# Patient Record
Sex: Female | Born: 1950
Health system: Southern US, Community
[De-identification: ages and names within clinical notes are randomized; demographics above are authoritative.]

## PROBLEM LIST (undated history)

## (undated) DIAGNOSIS — M8580 Other specified disorders of bone density and structure, unspecified site: Secondary | ICD-10-CM

## (undated) DIAGNOSIS — S52133A Displaced fracture of neck of unspecified radius, initial encounter for closed fracture: Secondary | ICD-10-CM

## (undated) DIAGNOSIS — F419 Anxiety disorder, unspecified: Secondary | ICD-10-CM

## (undated) DIAGNOSIS — F32A Depression, unspecified: Secondary | ICD-10-CM

## (undated) DIAGNOSIS — K635 Polyp of colon: Secondary | ICD-10-CM

## (undated) DIAGNOSIS — E039 Hypothyroidism, unspecified: Secondary | ICD-10-CM

## (undated) DIAGNOSIS — G2581 Restless legs syndrome: Secondary | ICD-10-CM

## (undated) DIAGNOSIS — K589 Irritable bowel syndrome without diarrhea: Secondary | ICD-10-CM

## (undated) DIAGNOSIS — K219 Gastro-esophageal reflux disease without esophagitis: Secondary | ICD-10-CM

## (undated) DIAGNOSIS — E7800 Pure hypercholesterolemia, unspecified: Secondary | ICD-10-CM

## (undated) DIAGNOSIS — F329 Major depressive disorder, single episode, unspecified: Secondary | ICD-10-CM

## (undated) DIAGNOSIS — G4733 Obstructive sleep apnea (adult) (pediatric): Secondary | ICD-10-CM

## (undated) HISTORY — DX: Other specified disorders of bone density and structure, unspecified site: M85.80

## (undated) HISTORY — DX: Displaced fracture of neck of unspecified radius, initial encounter for closed fracture: S52.133A

## (undated) HISTORY — DX: Restless legs syndrome: G25.81

## (undated) HISTORY — PX: BREAST BIOPSY: SHX20

## (undated) HISTORY — DX: Pure hypercholesterolemia, unspecified: E78.00

## (undated) HISTORY — DX: Gastro-esophageal reflux disease without esophagitis: K21.9

## (undated) HISTORY — DX: Hypothyroidism, unspecified: E03.9

## (undated) HISTORY — DX: Irritable bowel syndrome, unspecified: K58.9

## (undated) HISTORY — DX: Major depressive disorder, single episode, unspecified: F32.9

## (undated) HISTORY — DX: Obstructive sleep apnea (adult) (pediatric): G47.33

## (undated) HISTORY — PX: TONSILLECTOMY: SUR1361

## (undated) HISTORY — DX: Anxiety disorder, unspecified: F41.9

## (undated) HISTORY — DX: Depression, unspecified: F32.A

## (undated) HISTORY — DX: Polyp of colon: K63.5

---

## 1997-12-05 HISTORY — PX: ABDOMINAL HYSTERECTOMY: SHX81

## 1997-12-05 HISTORY — PX: APPENDECTOMY: SHX54

## 2003-07-24 LAB — HM DEXA SCAN

## 2005-05-17 ENCOUNTER — Ambulatory Visit: Payer: Self-pay | Admitting: Endocrinology

## 2005-09-12 ENCOUNTER — Ambulatory Visit: Payer: Self-pay | Admitting: Unknown Physician Specialty

## 2006-11-09 ENCOUNTER — Ambulatory Visit: Payer: Self-pay | Admitting: Unknown Physician Specialty

## 2007-10-10 ENCOUNTER — Emergency Department (HOSPITAL_COMMUNITY): Admission: EM | Admit: 2007-10-10 | Discharge: 2007-10-10 | Payer: Self-pay | Admitting: Emergency Medicine

## 2007-12-06 HISTORY — PX: ESOPHAGOGASTRODUODENOSCOPY: SHX1529

## 2008-02-19 ENCOUNTER — Ambulatory Visit: Payer: Self-pay | Admitting: Unknown Physician Specialty

## 2008-04-07 ENCOUNTER — Ambulatory Visit: Payer: Self-pay | Admitting: Unknown Physician Specialty

## 2009-02-19 ENCOUNTER — Ambulatory Visit: Payer: Self-pay | Admitting: Unknown Physician Specialty

## 2009-12-05 HISTORY — PX: COLONOSCOPY: SHX174

## 2010-03-16 LAB — HM MAMMOGRAPHY: HM Mammogram: NEGATIVE

## 2010-05-25 ENCOUNTER — Ambulatory Visit: Payer: Self-pay | Admitting: Unknown Physician Specialty

## 2010-06-10 ENCOUNTER — Ambulatory Visit: Payer: Self-pay | Admitting: Unknown Physician Specialty

## 2010-06-14 LAB — PATHOLOGY REPORT

## 2010-07-22 ENCOUNTER — Ambulatory Visit: Payer: Self-pay | Admitting: Unknown Physician Specialty

## 2010-07-28 ENCOUNTER — Ambulatory Visit: Payer: Self-pay | Admitting: Unknown Physician Specialty

## 2010-08-17 ENCOUNTER — Encounter: Admission: RE | Admit: 2010-08-17 | Discharge: 2010-08-17 | Payer: Self-pay | Admitting: Otolaryngology

## 2011-03-07 ENCOUNTER — Encounter (INDEPENDENT_AMBULATORY_CARE_PROVIDER_SITE_OTHER): Payer: 59 | Admitting: Family Medicine

## 2011-03-07 DIAGNOSIS — Z79899 Other long term (current) drug therapy: Secondary | ICD-10-CM

## 2011-03-07 DIAGNOSIS — E039 Hypothyroidism, unspecified: Secondary | ICD-10-CM

## 2011-03-07 DIAGNOSIS — Z0000 Encounter for general adult medical examination without abnormal findings: Secondary | ICD-10-CM

## 2011-03-07 DIAGNOSIS — E559 Vitamin D deficiency, unspecified: Secondary | ICD-10-CM

## 2011-03-07 DIAGNOSIS — E7800 Pure hypercholesterolemia, unspecified: Secondary | ICD-10-CM

## 2011-04-18 ENCOUNTER — Ambulatory Visit (INDEPENDENT_AMBULATORY_CARE_PROVIDER_SITE_OTHER): Payer: 59 | Admitting: Family Medicine

## 2011-04-18 ENCOUNTER — Ambulatory Visit: Payer: 59 | Admitting: Family Medicine

## 2011-04-18 ENCOUNTER — Encounter: Payer: Self-pay | Admitting: Family Medicine

## 2011-04-18 VITALS — BP 108/72 | HR 68 | Ht 66.0 in | Wt 177.0 lb

## 2011-04-18 DIAGNOSIS — F411 Generalized anxiety disorder: Secondary | ICD-10-CM | POA: Insufficient documentation

## 2011-04-18 DIAGNOSIS — K219 Gastro-esophageal reflux disease without esophagitis: Secondary | ICD-10-CM

## 2011-04-18 DIAGNOSIS — R7301 Impaired fasting glucose: Secondary | ICD-10-CM

## 2011-04-18 DIAGNOSIS — E7800 Pure hypercholesterolemia, unspecified: Secondary | ICD-10-CM | POA: Insufficient documentation

## 2011-04-18 DIAGNOSIS — E611 Iron deficiency: Secondary | ICD-10-CM

## 2011-04-18 DIAGNOSIS — E039 Hypothyroidism, unspecified: Secondary | ICD-10-CM | POA: Insufficient documentation

## 2011-04-18 LAB — CBC WITH DIFFERENTIAL/PLATELET
Basophils Absolute: 0.1 10*3/uL (ref 0.0–0.1)
HCT: 37.3 % (ref 36.0–46.0)
Hemoglobin: 11.9 g/dL — ABNORMAL LOW (ref 12.0–15.0)
Lymphocytes Relative: 37 % (ref 12–46)
Lymphs Abs: 1.5 10*3/uL (ref 0.7–4.0)
MCH: 26.8 pg (ref 26.0–34.0)
Monocytes Relative: 9 % (ref 3–12)
Neutrophils Relative %: 49 % (ref 43–77)
WBC: 4.1 10*3/uL (ref 4.0–10.5)

## 2011-04-18 NOTE — Progress Notes (Signed)
Patient presents today upon the recommendation of Dr. Imogene Burn, her psychiatrist.  He did some labs revealing a low ferritin (8), as well as normal B12 and Mg.  He told her to f/u with her PCP to discuss.  We did labs last month at her CPE, which showed a normal CBC, Hgb of 12.5. She denies vaginal bleeding, no recent flare of hemorrhoid bleeding, denies any bloody or black stools, no blood in urine, nosebleeds or other bleeding. She takes Nexium daily for reflux.  As long as she watches her diet closely, reflux is well controlled.  Has recurrent symptoms if misses 2-3 pills  Past Medical History  Diagnosis Date  . Vitamin D deficiency   . IBS (irritable bowel syndrome) constipation(dr elliot [place])  . Depression panic(dr.william chen)  . RLS (restless legs syndrome)   . GERD (gastroesophageal reflux disease) HH per pt EGD nl 2/06  . Hypothyroid   . Colon polyps   . Osteopenia hx  . Elevated cholesterol   . OSA (obstructive sleep apnea) moderate, not using CPAP, aware of risks    Past Surgical History  Procedure Date  . Abdominal hysterectomy 1999  . Colonoscopy 2011  . Appendectomy 1999    History   Social History  . Marital Status: Married    Spouse Name: N/A    Number of Children: N/A  . Years of Education: N/A   Occupational History  . Not on file.   Social History Main Topics  . Smoking status: Never Smoker   . Smokeless tobacco: Never Used  . Alcohol Use: Yes     socially, 1 glass wine bi-weekly  . Drug Use: No  . Sexually Active: Not on file   Other Topics Concern  . Not on file   Social History Narrative  . No narrative on file    Family History  Problem Relation Age of Onset  . Heart disease Mother   . Cancer Father     stomach and liver  . Sarcoidosis Sister   . Heart disease Brother 18  . Heart disease Maternal Aunt   . Heart disease Paternal Aunt   . Heart disease Paternal Uncle     Current outpatient prescriptions:Cholecalciferol (VITAMIN D)  2000 UNITS tablet, Take 2,000 Units by mouth daily.  , Disp: , Rfl: ;  desvenlafaxine (PRISTIQ) 50 MG 24 hr tablet, Take 50 mg by mouth daily.  , Disp: , Rfl: ;  esomeprazole (NEXIUM) 40 MG capsule, Take 40 mg by mouth daily before breakfast.  , Disp: , Rfl:  estrogens, conjugated, (PREMARIN) 0.45 MG tablet, Take 0.45 mg by mouth daily. Take daily for 21 days then do not take for 7 days. , Disp: , Rfl: ;  gabapentin (NEURONTIN) 300 MG capsule, Take 300 mg by mouth 3 (three) times daily.  , Disp: , Rfl: ;  levothyroxine (SYNTHROID, LEVOTHROID) 25 MCG tablet, Take 25 mcg by mouth daily.  , Disp: , Rfl: ;  LORazepam (ATIVAN) 0.5 MG tablet, Take 0.5 mg by mouth as needed.  , Disp: , Rfl:  magnesium gluconate (MAGONATE) 500 MG tablet, Take 500 mg by mouth as needed.  , Disp: , Rfl: ;  baclofen (LIORESAL) 10 MG tablet, , Disp: , Rfl:   Allergies  Allergen Reactions  . Zithromax (Azithromycin) Hives    ROS: RLS better since changing from Topamax to Neurontin.  Denies fevers, GI complaints, URI symptoms, any bleeding as per history of present illness, or other concerns.  Physical exam: Well-developed, well-nourished,  pleasant female in no distress BP 108/72  Pulse 68  Ht 5\' 6"  (1.676 m)  Wt 177 lb (80.287 kg)  BMI 28.57 kg/m2 Remainder of exam is limited to discussion. Full CPE last month.  Assessment and plan: 1. Iron deficiency  CBC with Differential, Ferritin, Iron, CBC with Differential  2. Impaired fasting glucose  HgB A1c, Glucose  3. GERD (gastroesophageal reflux disease)     We discussed the meaning of low ferritin in the absence of anemia. We discussed that her chronic PPI use may be impairing her absorption of iron. She requested repeat CBC today. Given handout of high iron foods. Future labs ordered 2 repeat CBC as well as fasting glucose and A1c

## 2011-04-18 NOTE — Patient Instructions (Signed)
Iron Rich Diet An iron rich diet contains foods that are good sources of iron. Iron is an important mineral. It is used to make hemoglobin. Hemoglobin is a protein needed for red blood cells so that oxygen can be carried through the body. The iron level in the blood can be low. Reasons for low iron in the blood include:  Lack of iron in the diet.   Blood loss.   During times of growth such as the growth and development of children or pregnancy.  Low levels of iron can cause a decrease in the number of red blood cells. This can result in iron deficiency anemia. Iron deficiency anemia symptoms include:  Lack of energy.   Increased chance of infection.   Other health problems.  Males older than 60 years of age need 8mg of iron per day. Women ages 19-50 need 18mg per day. Pregnant women need 27 mg per day, and women who are over 19 years of age and breastfeeding need 9mg per day. Women over the age of 50 need 8mg of iron per day. SOURCES OF IRON: There are two types of iron that are found in food; heme and non-heme iron. Heme iron is absorbed by the body better than non-heme iron. Heme iron is found in meat, poultry and fish. Non-heme iron is found in grains, beans, and vegetables. Heme iron sources:  Food Amount of iron in milligrams (mg)  3 oz. chicken liver.  10  3 oz. beef liver. 5.5  3 oz. oysters. 8  3 oz. beef. 2-3  3 oz. shrimp. 2.8  3 oz. turkey. 2  3 oz. chicken. 1  3 oz. fish (tuna, halibut). 1  3 oz. pork. 0.9  Non-heme iron sources: Food Amount of iron in milligrams (mg)  Ready-to-eat breakfast cereal, iron fortified. 3.9-7   cup tofu. 3.4   cup kidney beans. 2.6  Baked potato with skin. 2.7   cup asparagus. 2.2  Avocado. 2   cup dried peaches. 1.6   cup raisins. 1.5   1 cup soy milk. 1.5  1 slice whole wheat bread. 1.2  1 cup spinach. 0.8   cup broccoli. 0.6  CERTAIN FOODS INCREASE AND DECREASE IRON ABSORPTION: Foods that can DECREASE the body's absorption  of iron include:  Coffee.  Tea.   Fiber.  Soy.   Try to avoid these foods and beverages while eating meals with iron containing foods. Foods containing vitamin C INCREASE the body's absorption of iron. Foods that are high in vitamin C include many fruits and vegetables. Some good sources are:  Fresh orange juice.  Oranges.   Strawberries.   Mangos.   Grapefruit.   Red bell peppers.  Green bell peppers.   Broccoli.   Potatoes with skin.   Tomato juice.   Foods containing vitamin C can help increase the amount of iron your body absorbs from iron sources, especially from non-heme sources. Eat foods with vitamin C along with iron containing foods to increase your iron absorption. Document Released: 07/05/2005 Document Re-Released: 12/11/2007 Exit[employer] Patient Information 2011 [employer]. 

## 2011-04-20 ENCOUNTER — Telehealth: Payer: Self-pay | Admitting: *Deleted

## 2011-04-20 MED ORDER — INTEGRA F 125-1 MG PO CAPS: 1.0000 | ORAL_CAPSULE | Freq: Every day | ORAL | Status: DC

## 2011-04-20 NOTE — Telephone Encounter (Signed)
Spoke with pt ZO:XWRU. Pt notified that HGB was slightly low. Given samples of Integra 125mg  #8 and an RX for one month plus one refill. Pt is also scheduled for labs on 06/15/11 @ 8:30 am, we will add cbcd to that for 2 month recheck. Also given hemoccult x3, she will send back to [place] when completed.

## 2011-05-24 ENCOUNTER — Encounter: Payer: Self-pay | Admitting: Family Medicine

## 2011-05-24 DIAGNOSIS — G43909 Migraine, unspecified, not intractable, without status migrainosus: Secondary | ICD-10-CM | POA: Insufficient documentation

## 2011-06-13 ENCOUNTER — Encounter: Payer: Self-pay | Admitting: Family Medicine

## 2011-06-15 ENCOUNTER — Encounter: Payer: Self-pay | Admitting: Family Medicine

## 2011-06-15 ENCOUNTER — Ambulatory Visit (INDEPENDENT_AMBULATORY_CARE_PROVIDER_SITE_OTHER): Payer: 59 | Admitting: Family Medicine

## 2011-06-15 VITALS — BP 108/68 | HR 68 | Ht 66.0 in | Wt 178.0 lb

## 2011-06-15 DIAGNOSIS — E7800 Pure hypercholesterolemia, unspecified: Secondary | ICD-10-CM

## 2011-06-15 DIAGNOSIS — E611 Iron deficiency: Secondary | ICD-10-CM

## 2011-06-15 DIAGNOSIS — R7301 Impaired fasting glucose: Secondary | ICD-10-CM | POA: Insufficient documentation

## 2011-06-15 DIAGNOSIS — K219 Gastro-esophageal reflux disease without esophagitis: Secondary | ICD-10-CM

## 2011-06-15 LAB — POC HEMOCCULT BLD/STL (HOME/3-CARD/SCREEN): Fecal Occult Blood, POC: NEGATIVE

## 2011-06-15 NOTE — Progress Notes (Signed)
Patient presents for f/u on anemia.  Has been off of her Nexium for 2 weeks, as her GI wouldn't refill it without an appointment (as she had been taking it for so long).  She has an appointment scheduled with him soon. Seems to be doing well, with only occasional reflux.  Using Zantac prn with good relief.   She was put on Integra after last visit, but only took it for about a week, due to problems with constipation.  Trying to follow high iron diet.  Denies any bleeding, blood in stool or worsening fatigue. She brings in her stool cards today, which were negative x 3. She continues to work out very regularly, and is frustrated with the lack of weight loss--although she does admit that her clothes are fitting better.  She is also here for repeat labs--her fasting glucose was 112 in April, and she is here for repeat fasting glucose, as well as HbA1c testing to evaluate for diabetes.  Her lipids were elevated then, (Total chol 237, TG 175, HDL 54, LDL 148), and HS-CRP was average risk.  She is interested in getting particle size testing--specifically asking about Matthew Folks tests  Past Medical History  Diagnosis Date  . Vitamin D deficiency   . IBS (irritable bowel syndrome) constipation(dr elliot [place])  . RLS (restless legs syndrome)   . GERD (gastroesophageal reflux disease) HH per pt EGD nl 2/06  . Colon polyps   . Osteopenia hx  . Elevated cholesterol   . OSA (obstructive sleep apnea) moderate, not using CPAP, aware of risks    2007  . Depression panic(dr.william chen)  . Anxiety     GENERALIZED/WILLIAM CHEN MD  . Hypothyroid     Past Surgical History  Procedure Date  . Abdominal hysterectomy 1999    and BSO  . Colonoscopy 2011    Dr. Mechele Collin [facility], [place])  . Appendectomy 1999  . Esophagogastroduodenoscopy 2009    History   Social History  . Marital Status: Married    Spouse Name: N/A    Number of Children: N/A  . Years of Education: N/A   Occupational  History  . Not on file.   Social History Main Topics  . Smoking status: Never Smoker   . Smokeless tobacco: Never Used  . Alcohol Use: Yes     socially, 1 glass wine bi-weekly  . Drug Use: No  . Sexually Active: Not on file   Other Topics Concern  . Not on file   Social History Narrative  . No narrative on file    Family History  Problem Relation Age of Onset  . Heart disease Mother   . Cancer Father     stomach and liver  . Sarcoidosis Sister   . Heart disease Brother 49  . Heart disease Maternal Aunt   . Heart disease Paternal Aunt   . Heart disease Paternal Uncle     Current outpatient prescriptions:Cholecalciferol (VITAMIN D) 2000 UNITS tablet, Take 2,000 Units by mouth daily.  , Disp: , Rfl: ;  desvenlafaxine (PRISTIQ) 50 MG 24 hr tablet, Take 50 mg by mouth daily.  , Disp: , Rfl: ;  estrogens, conjugated, (PREMARIN) 0.45 MG tablet, Take 0.45 mg by mouth daily. , Disp: , Rfl: ;  gabapentin (NEURONTIN) 300 MG capsule, Take 300 mg by mouth 3 (three) times daily.  , Disp: , Rfl:  levothyroxine (LEVOXYL) 25 MCG tablet, Take 25 mcg by mouth daily.  , Disp: , Rfl: ;  magnesium gluconate (  MAGONATE) 500 MG tablet, Take 250 mg by mouth as needed. , Disp: , Rfl: ;  baclofen (LIORESAL) 10 MG tablet, , Disp: , Rfl: ;  esomeprazole (NEXIUM) 40 MG capsule, Take 40 mg by mouth daily before breakfast.  , Disp: , Rfl: ;  Fe Fum-FePoly-FA-Vit C-Vit B3 (INTEGRA F) 125-1 MG CAPS, Take 1 tablet by mouth daily., Disp: 8 capsule, Rfl: 0 LORazepam (ATIVAN) 0.5 MG tablet, Take 0.5 mg by mouth as needed.  , Disp: , Rfl:   Allergies  Allergen Reactions  . Zithromax (Azithromycin) Hives   ROS:  Denies fevers, URI symptoms, cough, SOB, chest pain.  See HPI re: GI, weight, lack of bleeding, etc.   PHYSICAL EXAM: BP 108/68  Pulse 68  Ht 5\' 6"  (1.676 m)  Wt 178 lb (80.74 kg)  BMI 28.73 kg/m2 Well developed, pleasant female in no distress Neck: no lymphadenopathy or thyromegaly Heart: regular  rate and rhythm without murmur Lungs: clear bilaterally Abdomen: No epigastric tenderness, hepatosplenomegaly or masses Extremities: no edema Skin: no rash  ASSESSMENT/PLAN: 1. Iron deficiency  Iron, Ferritin, CBC with Differential  2. Impaired fasting glucose  POCT HgB A1C, POCT Glucose (CBG)  3. Other disorders of iron metabolism  POCT Hemoccult (HOME) Blood/Stoool Cards  4. Pure hypercholesterolemia    5. GERD (gastroesophageal reflux disease)     Discussed longterm risks of untreated reflux vs PPI's.  Seems to be doing well with prn H2 blocker, I rec to continue this Low cholesterol diet reviewed Patient reassured that losing inches at the waist is as beneficial as losing weight, to continue with her current level of activity, and likely weight loss will soon follow.  [place] lipids  (requested by pt)  Send copies of labs to Dr. Lynnae Prude at [facility], in [place]

## 2011-06-16 ENCOUNTER — Telehealth: Payer: Self-pay | Admitting: *Deleted

## 2011-06-16 LAB — CBC WITH DIFFERENTIAL/PLATELET
Basophils Relative: 1 % (ref 0–1)
Eosinophils Relative: 2 % (ref 0–5)
Hemoglobin: 12.3 g/dL (ref 12.0–15.0)
Lymphs Abs: 1.6 10*3/uL (ref 0.7–4.0)
MCHC: 31.3 g/dL (ref 30.0–36.0)
MCV: 86 fL (ref 78.0–100.0)
Neutro Abs: 2 10*3/uL (ref 1.7–7.7)
Platelets: 256 10*3/uL (ref 150–400)
RBC: 4.57 MIL/uL (ref 3.87–5.11)
WBC: 4.2 10*3/uL (ref 4.0–10.5)

## 2011-06-16 LAB — FERRITIN: Ferritin: 8 ng/mL — ABNORMAL LOW (ref 10–291)

## 2011-06-16 NOTE — Telephone Encounter (Signed)
Spoke with patient and informed her of her lab results. Also let her know that I faxed current and previous labs to Dr.Elliott and I will be in touch once [facility] results are in.

## 2011-06-16 NOTE — Telephone Encounter (Signed)
Left message with patient's husband to return my call for lab results. Faxed labs from this encounter as well as the previous 2-3 to Dr.Elliot @ [facility] in [place]. Faxed to [phone].

## 2011-07-25 ENCOUNTER — Encounter: Payer: Self-pay | Admitting: Family Medicine

## 2011-07-25 ENCOUNTER — Ambulatory Visit (INDEPENDENT_AMBULATORY_CARE_PROVIDER_SITE_OTHER): Payer: 59 | Admitting: Family Medicine

## 2011-07-25 VITALS — BP 118/86 | HR 72 | Temp 98.4°F | Ht 66.0 in | Wt 171.0 lb

## 2011-07-25 DIAGNOSIS — J069 Acute upper respiratory infection, unspecified: Secondary | ICD-10-CM

## 2011-07-25 NOTE — Progress Notes (Signed)
Patient presents with complaint of sinus pain, fever, cough.  Began 2 days ago with sneezing, intense pain in her left cheek and upper and lower teeth.  Denies any runny nose, but feeling congested.  Now the pain is limited to her L cheek, teeth no longer painful.  Began coughing last night, nonproductive.  T100 this morning at home.  +sick contacts--husband began sneezing same time as her, but he is better.  Hasn't tried any OTC medications.  Doesn't like taking decongestants--they increase her anxiety  Past Medical History  Diagnosis Date  . Vitamin D deficiency   . IBS (irritable bowel syndrome) constipation(dr elliot [place])  . RLS (restless legs syndrome)   . GERD (gastroesophageal reflux disease) HH per pt EGD nl 2/06  . Colon polyps   . Osteopenia hx  . Elevated cholesterol   . OSA (obstructive sleep apnea) moderate, not using CPAP, aware of risks    2007  . Depression panic(dr.william chen)  . Anxiety     GENERALIZED/WILLIAM CHEN MD  . Hypothyroid     Past Surgical History  Procedure Date  . Abdominal hysterectomy 1999    and BSO  . Colonoscopy 2011    Dr. Mechele Collin [facility], [place])  . Appendectomy 1999  . Esophagogastroduodenoscopy 2009    History   Social History  . Marital Status: Married    Spouse Name: N/A    Number of Children: N/A  . Years of Education: N/A   Occupational History  . Not on file.   Social History Main Topics  . Smoking status: Never Smoker   . Smokeless tobacco: Never Used  . Alcohol Use: Yes     socially, 1 glass wine bi-weekly  . Drug Use: No  . Sexually Active: Not on file   Other Topics Concern  . Not on file   Social History Narrative  . No narrative on file    Family History  Problem Relation Age of Onset  . Heart disease Mother   . Cancer Father     stomach and liver  . Sarcoidosis Sister   . Heart disease Brother 30  . Heart disease Maternal Aunt   . Heart disease Paternal Aunt   . Heart disease  Paternal Uncle     Current outpatient prescriptions:Cholecalciferol (VITAMIN D) 2000 UNITS tablet, Take 2,000 Units by mouth daily.  , Disp: , Rfl: ;  desvenlafaxine (PRISTIQ) 50 MG 24 hr tablet, Take 50 mg by mouth daily.  , Disp: , Rfl: ;  estrogens, conjugated, (PREMARIN) 0.45 MG tablet, Take 0.45 mg by mouth daily. , Disp: , Rfl: ;  gabapentin (NEURONTIN) 300 MG capsule, Take 300 mg by mouth 3 (three) times daily.  , Disp: , Rfl:  levothyroxine (LEVOXYL) 25 MCG tablet, Take 25 mcg by mouth daily.  , Disp: , Rfl: ;  LORazepam (ATIVAN) 0.5 MG tablet, Take 0.5 mg by mouth as needed.  , Disp: , Rfl: ;  Magnesium 250 MG TABS, Take 1 tablet by mouth daily.  , Disp: , Rfl: ;  ranitidine (ZANTAC) 150 MG tablet, Take 150 mg by mouth as needed.  , Disp: , Rfl: ;  baclofen (LIORESAL) 10 MG tablet, , Disp: , Rfl:  esomeprazole (NEXIUM) 40 MG capsule, Take 40 mg by mouth daily before breakfast.  , Disp: , Rfl: ;  Fe Fum-FePoly-FA-Vit C-Vit B3 (INTEGRA F) 125-1 MG CAPS, Take 1 tablet by mouth daily., Disp: 8 capsule, Rfl: 0  Allergies  Allergen Reactions  . Zithromax (Azithromycin)  Hives   ROS:  See HPI.  Denies nausea, vomiting, skin rash, joint pains, or other concerns  PHYSICAL EXAM: BP 118/86  Pulse 72  Temp(Src) 98.4 F (36.9 C) (Oral)  Ht 5\' 6"  (1.676 m)  Wt 171 lb (77.565 kg)  BMI 27.60 kg/m2 Well developed female, moderately congested, in no distress.  Occasional dry cough HEENT: PERRL, EOMI, conjunctiva clear.  TM's and EAC's normal. Nasal mucosa only mildly edematous, no erythema or purulence.  Tender at L maxillary sinus.  OP-Mild cobblestoning posteriorly Lungs clear bilaterally Skin: no rash  ASSESSMENT/PLAN: 1. URI (upper respiratory infection)   Most likely viral, no evidence of bacterial infection at this time. Discussed signs and symptoms of progression to sinus infection  Mucinex Sinus rinses Patient doesn't tolerate decongestants due to increased anxiety symptoms  Call  later this week if worsening symptoms for ABX--allergic to z-pak, so would call in Augmentin.  Use with probiotics to help with the diarrhea.  If having new/different symptoms (ie. Chest pain, SOB) then OV recommended for re-evaluation.  Patient voices understanding.  Unable to determine what happened with specimen taken over a month ago and supposedly sent to [facility] results ever received.  Will check with Jovanka when she returns from vacation

## 2011-07-25 NOTE — Patient Instructions (Signed)
Call later this week if worsening symptoms for antibiotics--allergic to z-pak, so would call in Augmentin.  Use with probiotics to help with the diarrhea.  If having new/different symptoms (ie. Chest pain, SOB) then OV recommended for re-evaluation  Mucinex 1200mg  twice daily (NOT the D or DM, plain/maximum strength is okay) Try either sinus rinses or Neti-pot to help flush the sinuses and decrease pain Use anti-inflammatories (ie advil, motrin or aleve) and/or tylenol for pain, fever

## 2011-07-28 ENCOUNTER — Encounter: Payer: Self-pay | Admitting: Family Medicine

## 2011-07-28 DIAGNOSIS — E538 Deficiency of other specified B group vitamins: Secondary | ICD-10-CM | POA: Insufficient documentation

## 2011-07-29 ENCOUNTER — Telehealth: Payer: Self-pay | Admitting: Family Medicine

## 2011-07-29 MED ORDER — AMOXICILLIN-POT CLAVULANATE 875-125 MG PO TABS: 1.0000 | ORAL_TABLET | Freq: Two times a day (BID) | ORAL | Status: AC

## 2011-07-29 NOTE — Telephone Encounter (Signed)
LMOM that rx was sent to pharmacy

## 2011-08-01 ENCOUNTER — Other Ambulatory Visit: Payer: Self-pay | Admitting: *Deleted

## 2011-08-02 ENCOUNTER — Other Ambulatory Visit (INDEPENDENT_AMBULATORY_CARE_PROVIDER_SITE_OTHER): Payer: 59

## 2011-08-02 DIAGNOSIS — E538 Deficiency of other specified B group vitamins: Secondary | ICD-10-CM

## 2011-08-02 MED ORDER — CYANOCOBALAMIN 1000 MCG/ML IJ SOLN
1000.0000 ug | Freq: Once | INTRAMUSCULAR | Status: AC
  Administered 2011-08-02: 1000 ug via INTRAMUSCULAR

## 2011-08-09 ENCOUNTER — Other Ambulatory Visit (INDEPENDENT_AMBULATORY_CARE_PROVIDER_SITE_OTHER): Payer: 59

## 2011-08-09 DIAGNOSIS — E538 Deficiency of other specified B group vitamins: Secondary | ICD-10-CM

## 2011-08-09 MED ORDER — CYANOCOBALAMIN 1000 MCG/ML IJ SOLN
1000.0000 ug | Freq: Once | INTRAMUSCULAR | Status: AC
  Administered 2011-08-09: 1000 ug via INTRAMUSCULAR

## 2011-08-16 ENCOUNTER — Other Ambulatory Visit: Payer: 59

## 2011-08-23 ENCOUNTER — Other Ambulatory Visit (INDEPENDENT_AMBULATORY_CARE_PROVIDER_SITE_OTHER): Payer: 59

## 2011-08-23 DIAGNOSIS — E538 Deficiency of other specified B group vitamins: Secondary | ICD-10-CM

## 2011-08-23 MED ORDER — CYANOCOBALAMIN 1000 MCG/ML IJ SOLN
1000.0000 ug | Freq: Once | INTRAMUSCULAR | Status: AC
  Administered 2011-08-23: 1000 ug via INTRAMUSCULAR

## 2011-08-30 ENCOUNTER — Other Ambulatory Visit (INDEPENDENT_AMBULATORY_CARE_PROVIDER_SITE_OTHER): Payer: 59

## 2011-08-30 DIAGNOSIS — E559 Vitamin D deficiency, unspecified: Secondary | ICD-10-CM

## 2011-08-30 DIAGNOSIS — E538 Deficiency of other specified B group vitamins: Secondary | ICD-10-CM

## 2011-08-30 MED ORDER — CYANOCOBALAMIN 1000 MCG/ML IJ SOLN
1000.0000 ug | Freq: Once | INTRAMUSCULAR | Status: AC
  Administered 2011-08-30: 1000 ug via INTRAMUSCULAR

## 2011-09-12 ENCOUNTER — Telehealth: Payer: Self-pay | Admitting: Family Medicine

## 2011-09-12 NOTE — Telephone Encounter (Signed)
Please cal re B12  Gi suggested shots and then oral dosage. She finished shot and now time to start oral.  [facility]   [phone]

## 2011-09-12 NOTE — Telephone Encounter (Signed)
Spoke with patient and informed her that Dr.Knapp said B12 is an OTC supplement, usually 1mg . I checked in chart and [facility] did not specify an amount so I told her she may want to double check with them to make sure that 1mg  is the correct dosage that they want her to take. Pt verbalized understanding.

## 2011-09-12 NOTE — Telephone Encounter (Signed)
Please check note from GI at [facility]. Usually oral B12 is 1mg  daily, and is available OTC.  Please confirm with chart and notify pt.  Thanks

## 2012-01-10 ENCOUNTER — Ambulatory Visit: Payer: Self-pay | Admitting: Unknown Physician Specialty

## 2012-04-04 ENCOUNTER — Telehealth: Payer: Self-pay | Admitting: Family Medicine

## 2012-04-04 MED ORDER — LEVOTHYROXINE SODIUM 25 MCG PO TABS: 25.0000 ug | ORAL_TABLET | Freq: Every day | ORAL | Status: DC

## 2012-04-04 NOTE — Telephone Encounter (Signed)
Spoke with patient and she scheduled a CPE for 05/24/12-is this too long to wait to get TSH level? She said she would prefer to have labs done at CPE as opposed to labs prior if that is ok. Also she said that the pharmacy gave her levothyroxine a few months ago and she has been taking them instead of the brand name and said she feels better than she did on the brand name-is this ok to refill with the generic @ ?

## 2012-04-04 NOTE — Telephone Encounter (Signed)
Left message for patient to return my call.

## 2012-04-04 NOTE — Telephone Encounter (Signed)
It is fine to refill the generic until her appt

## 2012-04-04 NOTE — Telephone Encounter (Signed)
Please pull chart and see when last TSH done (none in computer, so she is likely due for med check and labs). Okay to refill until appt--with recall on Levoxyl, will need to change to Synthroid.  Please advise pt of need to change--keep on branded med.

## 2012-05-02 ENCOUNTER — Encounter: Payer: Self-pay | Admitting: Family Medicine

## 2012-05-02 ENCOUNTER — Ambulatory Visit (INDEPENDENT_AMBULATORY_CARE_PROVIDER_SITE_OTHER): Payer: 59 | Admitting: Family Medicine

## 2012-05-02 VITALS — BP 120/70 | HR 64 | Temp 97.8°F | Ht 66.0 in | Wt 179.0 lb

## 2012-05-02 DIAGNOSIS — R300 Dysuria: Secondary | ICD-10-CM

## 2012-05-02 LAB — POCT URINALYSIS DIPSTICK
Blood, UA: NEGATIVE
Ketones, UA: NEGATIVE
Protein, UA: NEGATIVE
pH, UA: 5

## 2012-05-02 MED ORDER — CIPROFLOXACIN HCL 250 MG PO TABS: 250.0000 mg | ORAL_TABLET | Freq: Two times a day (BID) | ORAL | Status: AC

## 2012-05-02 MED ORDER — FLUCONAZOLE 150 MG PO TABS: 150.0000 mg | ORAL_TABLET | Freq: Once | ORAL | Status: AC

## 2012-05-02 NOTE — Patient Instructions (Signed)
Drink plenty of fluids. If your symptoms of urgency, frequency, burning with urination get worse/recur, then please start the Cipro, and take it for the full 5 days.    Start the diflucan only if/when you get symptoms of a yeast infection.  We will call your cell phone with urine culture results when we get them.

## 2012-05-02 NOTE — Progress Notes (Signed)
Chief Complaint  Patient presents with  . Urinary Tract Infection    starting Friday morning with cloudiness and an strong odor until Monday. Since Monday she has had burning with urination but with more burning after voiding.     HPI: 5 days ago, noticed different odor to urine, and it was very cloudy.  Over the next few days, had similar symptoms in the morning, but later in the day had no problems (no odor or cloudiness).  2 days ago still had odor and cloudiness in the morning, but then also had some burning during the day.  This morning was less cloudy, less odor.  Yesterday was the worst as far as the burning with urinations.  Also had some L-sided lower abdominal burning that persisted after voiding, which would last about 1/2 hour.  Had some urgency and frequency yesterday, but seems better today.  Stream was somewhat weaker yesterday also.  Has been drinking more fluids today than over the weekend   Hypothyroid--in error got generic from pharmacy 3 months ago.  Is feeling a little better, so continued with it.  Past Medical History  Diagnosis Date  . Vitamin d deficiency   . IBS (irritable bowel syndrome) constipation(dr elliot [place])  . RLS (restless legs syndrome)   . GERD (gastroesophageal reflux disease) HH per pt EGD nl 2/06  . Colon polyps   . Osteopenia hx  . Elevated cholesterol   . OSA (obstructive sleep apnea) moderate, not using CPAP, aware of risks    2007  . Depression panic(dr.william chen)  . Anxiety     GENERALIZED/WILLIAM CHEN MD  . Hypothyroid    Past Surgical History  Procedure Date  . Abdominal hysterectomy 1999    and BSO  . Colonoscopy 2011    Dr. Mechele Collin [facility], [place])  . Appendectomy 1999  . Esophagogastroduodenoscopy 2009   History   Social History  . Marital Status: Married    Spouse Name: N/A    Number of Children: N/A  . Years of Education: N/A   Occupational History  . Not on file.   Social History Main Topics    . Smoking status: Never Smoker   . Smokeless tobacco: Never Used  . Alcohol Use: Yes     socially, 1 glass wine bi-weekly  . Drug Use: No  . Sexually Active: Not on file   Other Topics Concern  . Not on file   Social History Narrative  . No narrative on file   Current Outpatient Prescriptions on File Prior to Visit  Medication Sig Dispense Refill  . Cholecalciferol (VITAMIN D) 2000 UNITS tablet Take 2,000 Units by mouth daily.        Marland Kitchen desvenlafaxine (PRISTIQ) 50 MG 24 hr tablet Take 50 mg by mouth daily.        Marland Kitchen gabapentin (NEURONTIN) 300 MG capsule Take 300 mg by mouth at bedtime.       Marland Kitchen levothyroxine (LEVOXYL) 25 MCG tablet Take 1 tablet (25 mcg total) by mouth daily.  30 tablet  1  . LORazepam (ATIVAN) 0.5 MG tablet Take 0.5 mg by mouth as needed.        . Magnesium 250 MG TABS Take 1 tablet by mouth daily.        . baclofen (LIORESAL) 10 MG tablet       . ranitidine (ZANTAC) 150 MG tablet Take 150 mg by mouth as needed.         Allergies  Allergen Reactions  .  Zithromax (Azithromycin) Hives    ROS: Denies nausea, vomiting, fevers, chills, flank pain, other GI complaints.  Denies vaginal discharge.  PHYSICAL EXAM: BP 120/70  Pulse 64  Temp(Src) 97.8 F (36.6 C) (Oral)  Ht 5\' 6"  (1.676 m)  Wt 179 lb (81.194 kg)  BMI 28.89 kg/m2 Well developed, pleasant female in no distress Abdomen: tender across entire lower abdomen.  No rebound, guarding or masses Back: no CVA tenderness  Urine dip normal  ASSESSMENT/PLAN: 1. Burning with urination  POCT Urinalysis Dipstick, ciprofloxacin (CIPRO) 250 MG tablet, fluconazole (DIFLUCAN) 150 MG tablet, Urine culture    Given fairly classic symptoms of UTI, but symptoms improved today with increased hydration, will send for culture.  Will give Rx for Cipro--to start in the next 1-2 days if symptoms persist/worsen (she is going out of town).  Call with culture results when available. Also sending rx for diflucan given h/o yeast  infections with all ABX.  Understands to complete the full course of ABX if she starts them, and to only use the diflucan when symptoms develop.  Cell [phone]--call with results   Hypothyroidism--discussed the fact that Levoxyl won't be available again until 2014; doing well on generic.  Will continue that for now

## 2012-05-05 LAB — URINE CULTURE: Colony Count: >=100000

## 2012-05-15 ENCOUNTER — Telehealth: Payer: Self-pay | Admitting: Family Medicine

## 2012-05-15 MED ORDER — CIPROFLOXACIN HCL 500 MG PO TABS: 500.0000 mg | ORAL_TABLET | Freq: Two times a day (BID) | ORAL | Status: DC

## 2012-05-15 NOTE — Telephone Encounter (Signed)
Advise pt--will rx higher dose of Cipro, x 7 days.  If symptoms don't improve, I'd like to send another urine culture.  Rx sent to pharmacy for pt.  Please advise.  Septra (a sulfa drug) would be another antibiotic option to try, as her recent urine culture showed sensitivity to both sulfa drug and cipro--if she prefers sulfa over higher dose of cipro, then ok to change to septra DS BID x 7 days instead of the cipro. (no allergy listed in chart).

## 2012-05-15 NOTE — Telephone Encounter (Signed)
Pt called and informed and she said she would stay with the cipro

## 2012-05-16 IMAGING — US ULTRASOUND LEFT BREAST
1 series · 17 of 25 positions shown · non-contrast
Comparison: none

REASON FOR EXAM: DENSITY
COMMENTS:

[Series 1: ultrasound left breast · 17 of 41 slices shown]
[im 1/41]
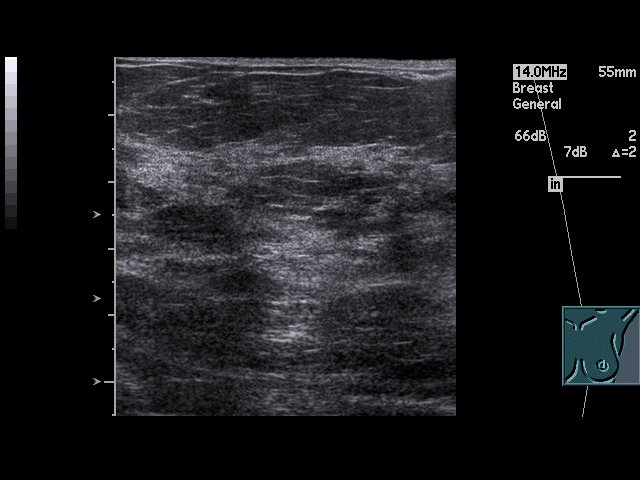
[im 4/41]
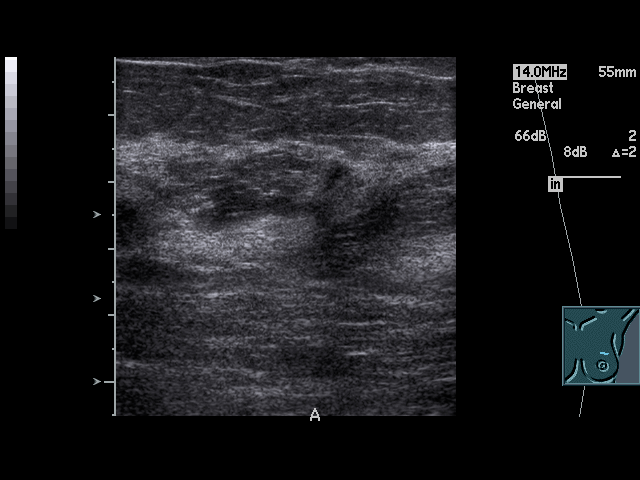
[im 6/41]
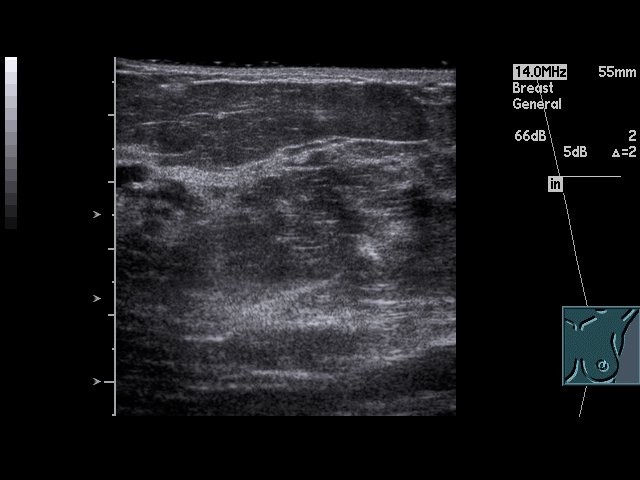
[im 9/41]
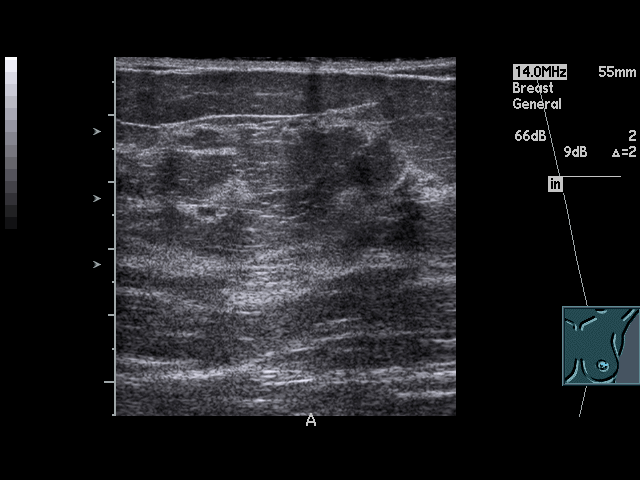
[im 11/41]
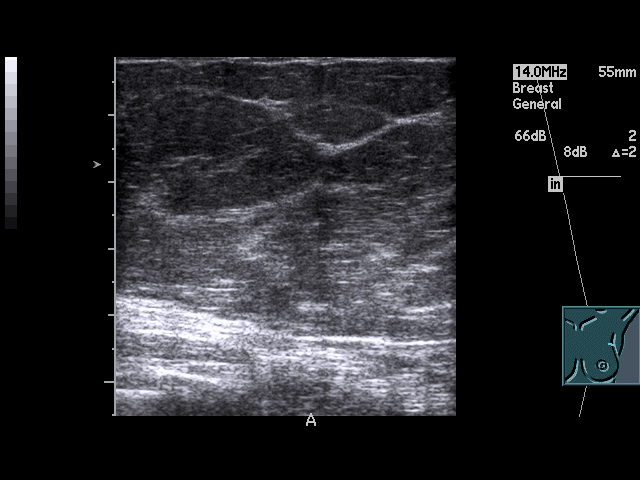
[im 14/41]
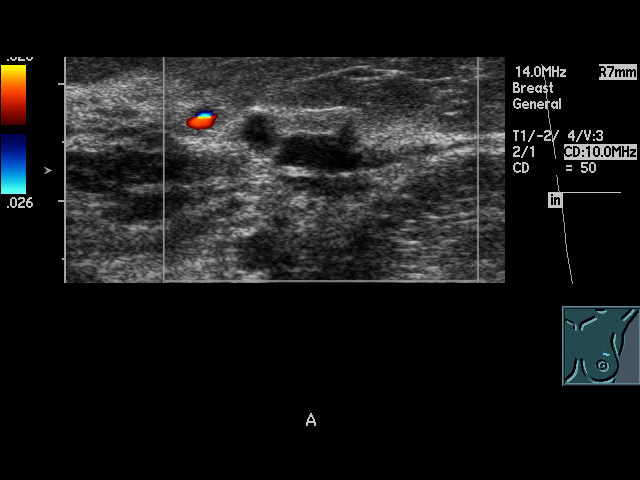
[im 16/41]
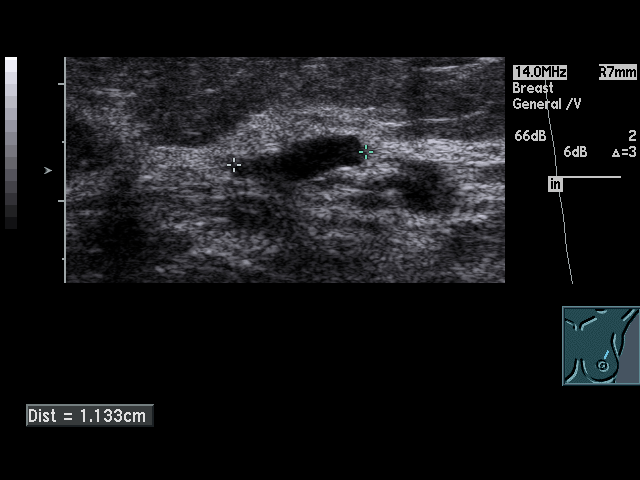
[im 19/41]
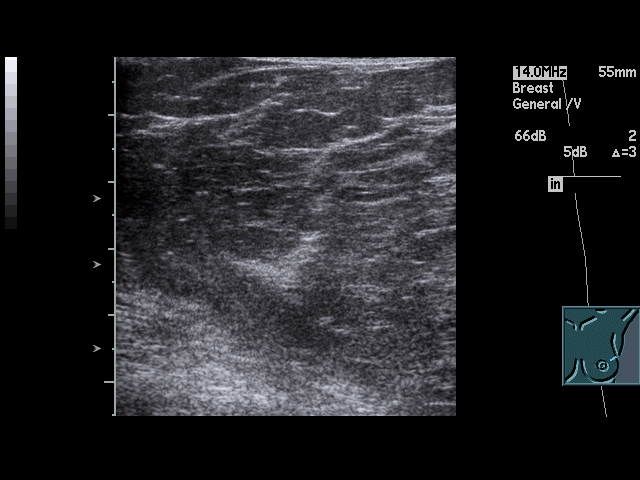
[im 21/41]
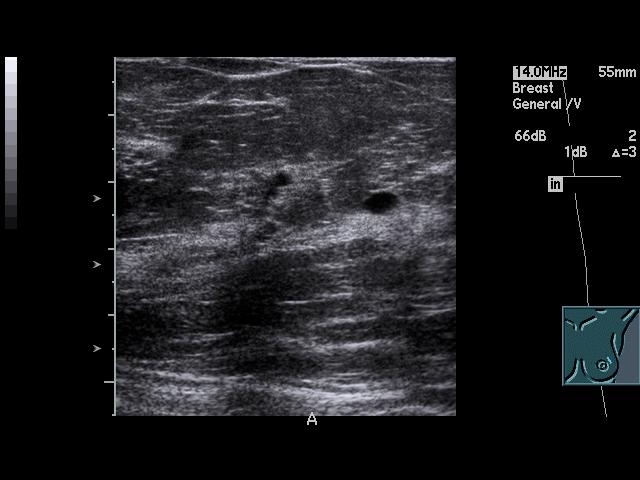
[im 22/41]
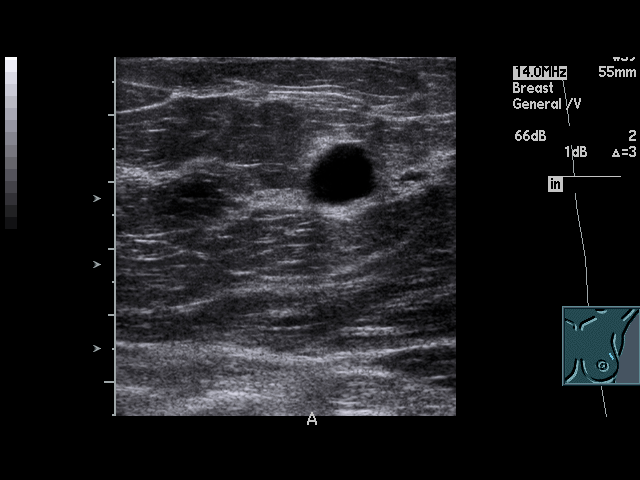
[im 26/41]
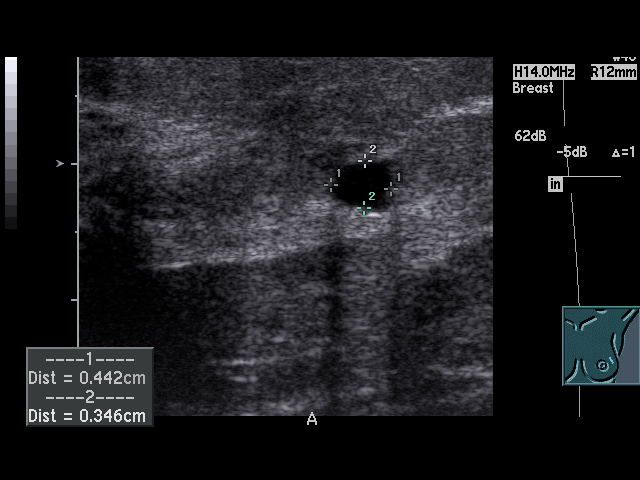
[im 27/41]
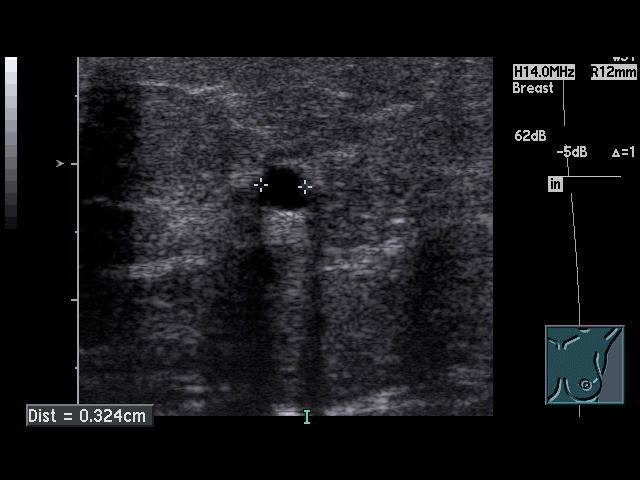
[im 31/41]
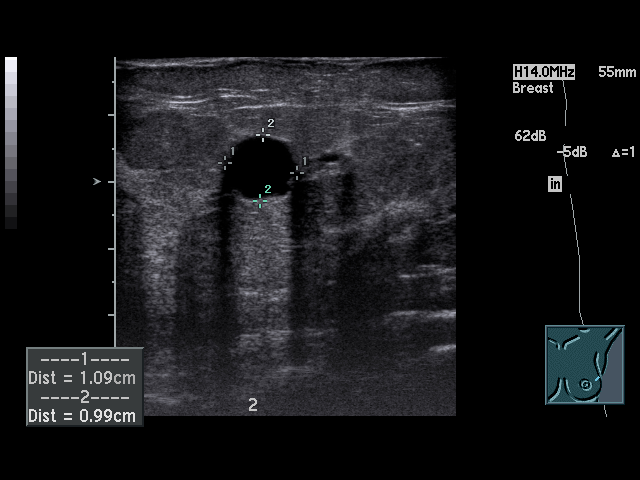
[im 32/41]
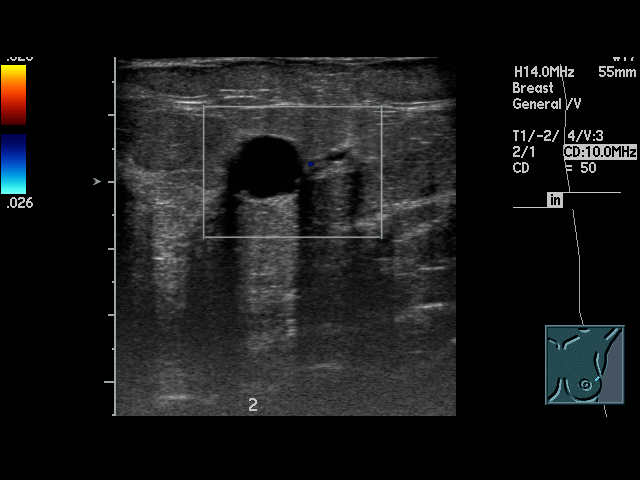
[im 36/41]
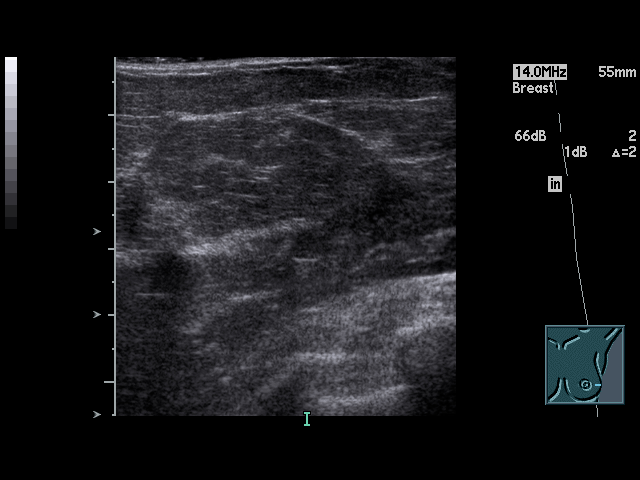
[im 37/41]
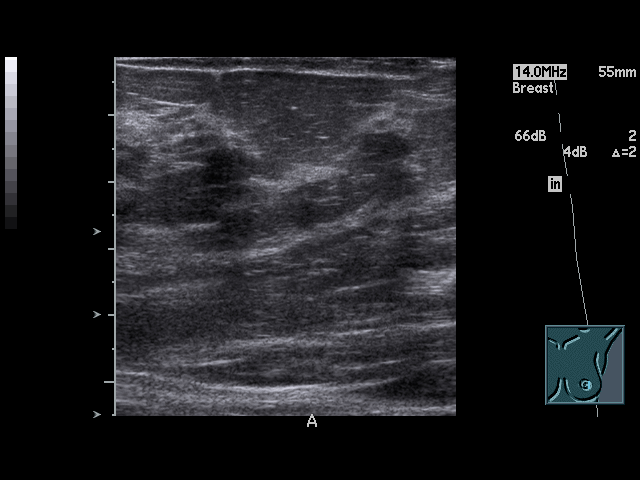
[im 41/41]
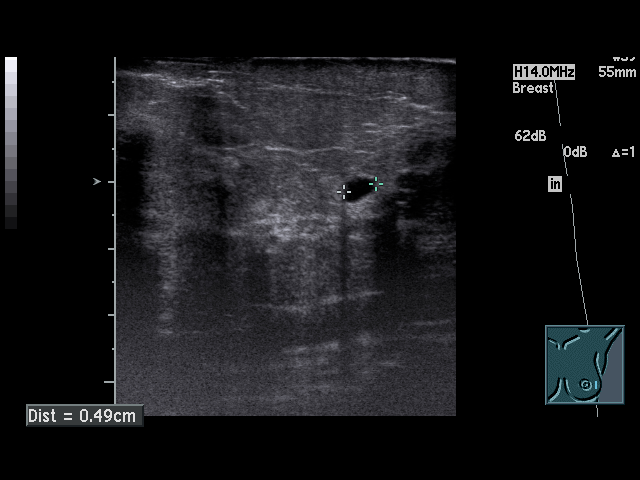

[17 of 25 positions shown; findings below may reference images not displayed]

PROCEDURE:     US  - US BREAST LEFT  - July 28, 2010  [DATE]

RESULT:

On the mammographic study [DATE] and on today's supplemental
mammographic views there are densities in the upper outer aspect of the left
breast. At ultrasound these are seen to be anechoic and somewhat irregularly
marginated in several regions. In addition, there is a hypoechoic area at
the 1 o'clock position. At the 2 o'clock position there is a dominant cystic
appearing structure measuring 11 x 10 x 14 mm. Smaller cystic structures at
the 2 o'clock position and 3 o'clock position are demonstrated. The
irregularly marginated, hypoechoic region at the 1 o'clock position likely
reflects dilated ducts and measures 1.5 and 0.5 x 1.1 cm and does not
clearly correspond to a mammographic density.
IMPRESSION: There are multiple cysts in the upper outer quadrant of the
left breast as well as likely a small group of dilated ducts. Please see the
dictation for the mammogram of this same day for final recommendations and
BI-RADS classification.

## 2012-05-21 ENCOUNTER — Telehealth: Payer: Self-pay | Admitting: *Deleted

## 2012-05-21 DIAGNOSIS — N390 Urinary tract infection, site not specified: Secondary | ICD-10-CM

## 2012-05-21 MED ORDER — SULFAMETHOXAZOLE-TMP DS 800-160 MG PO TABS: 1.0000 | ORAL_TABLET | Freq: Two times a day (BID) | ORAL | Status: DC

## 2012-05-21 NOTE — Telephone Encounter (Signed)
She will come in tomorrow for UA and Urine culture dx:599.0.  Called into Septra DS #14 BID x 7 days to [facility] and she will start medication AFTER she gives clean catch specimen tomorrow.

## 2012-05-21 NOTE — Telephone Encounter (Signed)
Patient called and stated that she has been on 2 rounds of Cipro for her UTI-last telephone call stated that she could either take a higher dose of Cipro (which she chose to do) or a sulfa abx. You suggested to do another urine culture if symptoms did not improve. Patient is very uncomfortable, still having burning after urination as well as some increased frequency. She has a CPE scheduled for 05/24/12. Please advise.

## 2012-05-21 NOTE — Telephone Encounter (Signed)
Have her give [place] a clean catch urine specimen for urine dip and culture.  We need this prior to starting any additional ABX. If she prefers to change to sulfa while waiting results, okay for #14 of Septra DS

## 2012-05-22 ENCOUNTER — Other Ambulatory Visit (INDEPENDENT_AMBULATORY_CARE_PROVIDER_SITE_OTHER): Payer: 59

## 2012-05-22 DIAGNOSIS — N390 Urinary tract infection, site not specified: Secondary | ICD-10-CM

## 2012-05-22 LAB — POCT URINALYSIS DIPSTICK
Bilirubin, UA: NEGATIVE
Blood, UA: NEGATIVE
Ketones, UA: NEGATIVE
Nitrite, UA: NEGATIVE
Spec Grav, UA: 1.01
Urobilinogen, UA: NEGATIVE

## 2012-05-24 ENCOUNTER — Encounter: Payer: Self-pay | Admitting: Family Medicine

## 2012-05-24 ENCOUNTER — Ambulatory Visit (INDEPENDENT_AMBULATORY_CARE_PROVIDER_SITE_OTHER): Payer: 59 | Admitting: Family Medicine

## 2012-05-24 VITALS — BP 98/60 | HR 64 | Ht 66.0 in | Wt 179.0 lb

## 2012-05-24 DIAGNOSIS — E7800 Pure hypercholesterolemia, unspecified: Secondary | ICD-10-CM

## 2012-05-24 DIAGNOSIS — Z0000 Encounter for general adult medical examination without abnormal findings: Secondary | ICD-10-CM

## 2012-05-24 DIAGNOSIS — R7301 Impaired fasting glucose: Secondary | ICD-10-CM

## 2012-05-24 DIAGNOSIS — E538 Deficiency of other specified B group vitamins: Secondary | ICD-10-CM

## 2012-05-24 DIAGNOSIS — E039 Hypothyroidism, unspecified: Secondary | ICD-10-CM

## 2012-05-24 DIAGNOSIS — R300 Dysuria: Secondary | ICD-10-CM

## 2012-05-24 LAB — CBC WITH DIFFERENTIAL/PLATELET
Basophils Absolute: 0.1 10*3/uL (ref 0.0–0.1)
Basophils Relative: 1 % (ref 0–1)
Eosinophils Relative: 2 % (ref 0–5)
HCT: 45.2 % (ref 36.0–46.0)
MCH: 30.5 pg (ref 26.0–34.0)
MCHC: 33.2 g/dL (ref 30.0–36.0)
MCV: 91.9 fL (ref 78.0–100.0)
Monocytes Absolute: 0.6 10*3/uL (ref 0.1–1.0)
Monocytes Relative: 10 % (ref 3–12)
Neutrophils Relative %: 55 % (ref 43–77)
RBC: 4.92 MIL/uL (ref 3.87–5.11)
RDW: 13.6 % (ref 11.5–15.5)

## 2012-05-24 LAB — COMPREHENSIVE METABOLIC PANEL
ALT: 13 U/L (ref 0–35)
AST: 20 U/L (ref 0–37)
Albumin: 4.6 g/dL (ref 3.5–5.2)
Alkaline Phosphatase: 64 U/L (ref 39–117)
Glucose, Bld: 103 mg/dL — ABNORMAL HIGH (ref 70–99)
Potassium: 4.7 mEq/L (ref 3.5–5.3)

## 2012-05-24 LAB — TSH: TSH: 2.639 u[IU]/mL (ref 0.350–4.500)

## 2012-05-24 LAB — URINE CULTURE: Organism ID, Bacteria: NO GROWTH

## 2012-05-24 NOTE — Progress Notes (Signed)
Chief Complaint  Patient presents with  . Annual Exam    fasting CPE-no vision just eye examlast month and we did not do UA as she just had one Tues and IS taking Bactrim DS. Up to date on gyn care-her gyn did retire this past Sept and she is looking for a new gyn.   There is no immunization history on file for this patient. Tetanus approximately 4 years ago Last Pap smear: 08/2011 Last mammogram: 08/2011 Last colonoscopy: 2011 Dr. Mechele Collin in [place] Last DEXA: 5 years ago, ?slight abnormal, through GYN Dentist: twice yearly Ophtho: recent, yearly Exercise: works out with trainer 3 days/week, and plans to start swimming.  Recent UTI.  Took a course of Cipro, symptoms resolved, but recurred shortly after finishing course.  She was originally given higher dose of Cipro course, but symptoms weren't improving.  Took Cipro --last dose Monday morning.  U/a and culture was from Tuesday around noon.  Took cipro for about 5 days.  Symptoms weren't improving.  Weren't getting worse. Started Septra 2 days ago, and having some improvement, but some residual burning.  Having some burning about 5 minutes after voiding. Denies urgency and frequency (only had mild, seems to be improving). Culture from 5/29 showed enterobacter UTI, sensitive to both cipro and septra.  Had been on HRT until 3 months ago. Having some hot flashes, tolerable.  GERD: much improved--Denies reflux symptoms, and off medications  B12 deficiency--s/p shots initially, on oral replacement now, due for labs.  Denies numbness/tingling.  Past Medical History  Diagnosis Date  . Vitamin d deficiency   . IBS (irritable bowel syndrome) constipation(dr elliot [place])  . RLS (restless legs syndrome)   . GERD (gastroesophageal reflux disease) HH per pt EGD nl 2/06  . Colon polyps   . Osteopenia hx  . Elevated cholesterol   . OSA (obstructive sleep apnea) moderate, not using CPAP, aware of risks    2007  . Depression  panic(dr.[email or web address] chen)  . Anxiety     GENERALIZED/WILLIAM CHEN MD  . Hypothyroid     Past Surgical History  Procedure Date  . Abdominal hysterectomy 1999    and BSO (for benign reasons)  . Colonoscopy 2011    Dr. Mechele Collin [facility] [place])  . Appendectomy 1999  . Esophagogastroduodenoscopy 2009    History   Social History  . Marital Status: Married    Spouse Name: N/A    Number of Children: 1  . Years of Education: N/A   Occupational History  . homemaker    Social History Main Topics  . Smoking status: Never Smoker   . Smokeless tobacco: Never Used  . Alcohol Use: Yes     socially, 1 glass wine bi-weekly  . Drug Use: No  . Sexually Active: Yes -- Female partner(s)   Other Topics Concern  . Not on file   Social History Narrative   Lives with husband, dog    Family History  Problem Relation Age of Onset  . Heart disease Mother   . Cancer Father     stomach and liver  . Sarcoidosis Sister   . Heart disease Brother 63  . Heart disease Maternal Aunt   . Heart disease Paternal Aunt   . Heart disease Paternal Uncle   . Stroke Neg Hx    Current Outpatient Prescriptions on File Prior to Visit  Medication Sig Dispense Refill  . baclofen (LIORESAL) 10 MG tablet       . Cholecalciferol (VITAMIN D) 2000  UNITS tablet Take 2,000 Units by mouth daily.        . cyanocobalamin 1000 MCG tablet Take 1,000 mcg by mouth daily.      Marland Kitchen desvenlafaxine (PRISTIQ) 50 MG 24 hr tablet Take 50 mg by mouth daily.        Marland Kitchen gabapentin (NEURONTIN) 300 MG capsule Take 300 mg by mouth at bedtime.       Marland Kitchen levothyroxine (LEVOXYL) 25 MCG tablet Take 1 tablet (25 mcg total) by mouth daily.  30 tablet  1  . LORazepam (ATIVAN) 0.5 MG tablet Take 0.5 mg by mouth as needed.        . Magnesium 250 MG TABS Take 1 tablet by mouth daily.        . ranitidine (ZANTAC) 150 MG tablet Take 150 mg by mouth as needed.        . sulfamethoxazole-trimethoprim (BACTRIM DS) 800-160 MG per tablet Take 1  tablet by mouth 2 (two) times daily.  14 tablet  0    Allergies  Allergen Reactions  . Zithromax (Azithromycin) Hives    ROS:  The patient denies anorexia, fever, weight changes, headaches (infrequent--sees neuro),  vision changes, decreased hearing, ear pain, sore throat, breast concerns, chest pain, palpitations, dizziness, syncope, dyspnea on exertion, cough, swelling, nausea, vomiting, diarrhea, constipation, abdominal pain, melena, hematochezia, indigestion/heartburn, hematuria, incontinence, vaginal bleeding, discharge, odor or itch, genital lesions, joint pains, numbness, tingling, weakness, tremor, suspicious skin lesions, depression, abnormal bleeding/bruising, or enlarged lymph nodes. Some knee pain--saw ortho  PHYSICAL EXAM:  BP 98/60  Pulse 64  Ht 5\' 6"  (1.676 m)  Wt 179 lb (81.194 kg)  BMI 28.89 kg/m2  General Appearance:    Alert, cooperative, no distress, appears stated age  Head:    Normocephalic, without obvious abnormality, atraumatic  Eyes:    PERRL, conjunctiva/corneas clear, EOM's intact, fundi    benign  Ears:    Normal TM's and external ear canals  Nose:   Nares normal, mucosa normal, no drainage or sinus   tenderness  Throat:   Lips, mucosa, and tongue normal; teeth and gums normal  Neck:   Supple, no lymphadenopathy;  thyroid:  no   enlargement/tenderness/nodules; no carotid   bruit or JVD  Back:    Spine nontender, no curvature, ROM normal, no CVA  tenderness. nontender subcutaneous mass below L flank area--likely lipoma  Lungs:     Clear to auscultation bilaterally without wheezes, rales or     ronchi; respirations unlabored  Chest Wall:    No tenderness or deformity   Heart:    Regular rate and rhythm, S1 and S2 normal, no murmur, rub   or gallop  Breast Exam:    No nipple inversion, nipple discharge.  No dominant masses or axillary lymphadenopathy.    Abdomen:     Soft, non-tender, nondistended, normoactive bowel sounds,    no masses, no  hepatosplenomegaly  Genitalia:   normal external genitalia, with only mild atrophic changes.  Bimanual exam is normal--no appreciable masses.  Uterus and ovaries surgically absent.   Rectal exam--normal sphincter tone, brown, heme negative stool, no mass  Extremities:   No clubbing, cyanosis or edema  Pulses:   2+ and symmetric all extremities  Skin:   Skin color, texture, turgor normal, no rashes or lesions  Lymph nodes:   Cervical, supraclavicular, and axillary nodes normal  Neurologic:   CNII-XII intact, normal strength, sensation and gait; reflexes 2+ and symmetric throughout  Psych:   Normal mood, affect, hygiene and grooming.    ASSESSMENT/PLAN:  1. Unspecified hypothyroidism  TSH  2. Impaired fasting glucose  Comprehensive metabolic panel, Hemoglobin A1c  3. B12 deficiency  CBC with Differential, Vitamin B12  4. Pure hypercholesterolemia  Comprehensive metabolic panel, NMR Lipoprofile with Lipids  5. Dysuria     on treatment for UTI with Septra.  Consider topical estrogen creams if ongoing symptoms and repeat cultures negative.   Discussed monthly self breast exams and yearly mammograms; at least 30 minutes of aerobic activity at least 5 days/week; proper sunscreen use reviewed; healthy diet, including goals of calcium and vitamin D intake and alcohol recommendations (less than or equal to 1 drink/day) reviewed; regular seatbelt use; changing batteries in smoke detectors.  Immunization recommendations discussed--risks and benefits of shingles vaccine were reviewed.  Will check with her insurance and set up nurse visit if interested.  Colonoscopy recommendations reviewed--UTD  Dysuria/UTI--Complete course of Septra.  If symptoms don't completely resolve, then return (after a week off ABX) for repeat urine culture.  Call sooner if worsening symptoms (ie, fevers, flank pain, etc.)  Dysuria could be related to atrophic changes related to going off on hormone replacement.  Can  consider topical estrogen therapy  Labs today: TSH, B12, CBC, c-met, A1c, lipids and particle size

## 2012-05-24 NOTE — Patient Instructions (Addendum)
HEALTH MAINTENANCE RECOMMENDATIONS:  It is recommended that you get at least 30 minutes of aerobic exercise at least 5 days/week (for weight loss, you may need as much as 60-90 minutes). This can be any activity that gets your heart rate up. This can be divided in 10-15 minute intervals if needed, but try and build up your endurance at least once a week.  Weight bearing exercise is also recommended twice weekly.  Eat a healthy diet with lots of vegetables, fruits and fiber.  "Colorful" foods have a lot of vitamins (ie green vegetables, tomatoes, red peppers, etc).  Limit sweet tea, regular sodas and alcoholic beverages, all of which has a lot of calories and sugar.  Up to 1 alcoholic drink daily may be beneficial for women (unless trying to lose weight, watch sugars).  Drink a lot of water.  Calcium recommendations are 1200-1500 mg daily (1500 mg for postmenopausal women or women without ovaries), and vitamin D 1000 IU daily.  This should be obtained from diet and/or supplements (vitamins), and calcium should not be taken all at once, but in divided doses.  Monthly self breast exams and yearly mammograms for women over the age of 38 is recommended.  Sunscreen of at least SPF 30 should be used on all sun-exposed parts of the skin when outside between the hours of 10 am and 4 pm (not just when at beach or pool, but even with exercise, golf, tennis, and yard work!)  Use a sunscreen that says "broad spectrum" so it covers both UVA and UVB rays, and make sure to reapply every 1-2 hours.  Remember to change the batteries in your smoke detectors when changing your clock times in the spring and fall.  Use your seat belt every time you are in a car, and please drive safely and not be distracted with cell phones and texting while driving.  Complete course of Septra.  If symptoms don't completely resolve, then return (after a week off ABX) for repeat urine culture.  Call sooner if worsening symptoms (ie,  fevers, flank pain, etc.)  Date and type of tetanus shot given at urgent care--see if you can get, and call/fax [place] with the date/type  Set up a nurse visit for shingles vaccine if interested, after checking with insurance for coverage.

## 2012-05-25 LAB — HEMOGLOBIN A1C: Mean Plasma Glucose: 123 mg/dL — ABNORMAL HIGH (ref <117)

## 2012-05-25 MED ORDER — LEVOTHYROXINE SODIUM 25 MCG PO TABS: 25.0000 ug | ORAL_TABLET | Freq: Every day | ORAL | Status: DC

## 2012-05-26 LAB — NMR LIPOPROFILE WITH LIPIDS
LDL (calc): 186 mg/dL — ABNORMAL HIGH (ref <100)
LDL Particle Number: 1940 nmol/L — ABNORMAL HIGH (ref <1000)
LDL Size: 21.7 nm (ref >20.5)
LP-IR Score: 31 (ref <=45)
Triglycerides: 94 mg/dL (ref <150)

## 2012-06-02 ENCOUNTER — Other Ambulatory Visit: Payer: Self-pay | Admitting: Family Medicine

## 2012-06-06 ENCOUNTER — Other Ambulatory Visit (INDEPENDENT_AMBULATORY_CARE_PROVIDER_SITE_OTHER): Payer: 59

## 2012-06-06 ENCOUNTER — Other Ambulatory Visit: Payer: 59

## 2012-06-06 DIAGNOSIS — R300 Dysuria: Secondary | ICD-10-CM

## 2012-06-06 LAB — POCT URINALYSIS DIPSTICK
Glucose, UA: NEGATIVE
Ketones, UA: NEGATIVE
Leukocytes, UA: NEGATIVE
Protein, UA: NEGATIVE
Urobilinogen, UA: NEGATIVE

## 2012-06-08 LAB — CULTURE, URINE COMPREHENSIVE
Colony Count: NO GROWTH
Organism ID, Bacteria: NO GROWTH

## 2012-06-11 ENCOUNTER — Other Ambulatory Visit: Payer: Self-pay | Admitting: *Deleted

## 2012-06-11 MED ORDER — ESTROGENS, CONJUGATED 0.625 MG/GM VA CREA: TOPICAL_CREAM | VAGINAL | Status: DC

## 2012-06-28 ENCOUNTER — Ambulatory Visit (INDEPENDENT_AMBULATORY_CARE_PROVIDER_SITE_OTHER): Payer: 59 | Admitting: Family Medicine

## 2012-06-28 ENCOUNTER — Encounter: Payer: Self-pay | Admitting: Family Medicine

## 2012-06-28 VITALS — BP 118/76 | HR 68 | Ht 66.0 in | Wt 169.0 lb

## 2012-06-28 DIAGNOSIS — E538 Deficiency of other specified B group vitamins: Secondary | ICD-10-CM

## 2012-06-28 DIAGNOSIS — R7301 Impaired fasting glucose: Secondary | ICD-10-CM

## 2012-06-28 DIAGNOSIS — E7800 Pure hypercholesterolemia, unspecified: Secondary | ICD-10-CM

## 2012-06-28 NOTE — Patient Instructions (Signed)
Continue diet, weight loss.  Return in 6 months for fasting labs and visit after

## 2012-06-28 NOTE — Progress Notes (Signed)
Chief Complaint  Patient presents with  . Advice Only    saw Durwin Nora and may need CRP and vitamin D level-last levels we had of these were 03/2011. Also would like iron level checked. (Dr Imogene Burn checked Ferritin, Mg and B12 03/2011-in chart) and DrKnapp did  iron and ferritin 06/2011(in computer).   HPI:  She saw Durwin Nora, and was given a diet plan.  She has lost 10 pounds since seeing her.  She isn't eating any sweets or fats, eating more yogurt  Has appt with Dr. Tresa Endo in September, due to her family h/o heart disease in brother.  Still having some mild burning with urination, improved from previously.  Hasn't restarted the estrogen.  Has had negative urine cultures.    Past Medical History  Diagnosis Date  . Vitamin d deficiency   . IBS (irritable bowel syndrome) constipation(dr elliot [place])  . RLS (restless legs syndrome)   . GERD (gastroesophageal reflux disease) HH per pt EGD nl 2/06  . Colon polyps   . Osteopenia hx  . Elevated cholesterol   . OSA (obstructive sleep apnea) moderate, not using CPAP, aware of risks    2007  . Depression panic(dr.william chen)  . Anxiety     GENERALIZED/WILLIAM CHEN MD  . Hypothyroid    Past Surgical History  Procedure Date  . Abdominal hysterectomy 1999    and BSO (for benign reasons)  . Colonoscopy 2011    Dr. Mechele Collin [facility] [place])  . Appendectomy 1999  . Esophagogastroduodenoscopy 2009   Current Outpatient Prescriptions on File Prior to Visit  Medication Sig Dispense Refill  . baclofen (LIORESAL) 10 MG tablet       . Cholecalciferol (VITAMIN D) 2000 UNITS tablet Take 2,000 Units by mouth daily.        Marland Kitchen conjugated estrogens (PREMARIN) vaginal cream Use daily for first week and then 3 times per week.  42.5 g  1  . cyanocobalamin 1000 MCG tablet Take 1,000 mcg by mouth daily.      Marland Kitchen desvenlafaxine (PRISTIQ) 50 MG 24 hr tablet Take 50 mg by mouth daily.        Marland Kitchen levothyroxine (LEVOXYL) 25 MCG  tablet Take 1 tablet (25 mcg total) by mouth daily.  90 tablet  3  . levothyroxine (SYNTHROID, LEVOTHROID) 25 MCG tablet TAKE 1 TABLET BY MOUTH EVERY DAY  30 tablet  5  . LORazepam (ATIVAN) 0.5 MG tablet Take 0.5 mg by mouth as needed.        . Magnesium 250 MG TABS Take 1 tablet by mouth daily.        . ranitidine (ZANTAC) 150 MG tablet Take 150 mg by mouth as needed.         Allergies  Allergen Reactions  . Zithromax (Azithromycin) Hives   ROS: denies fevers, chest pain, shortness of breath, polydipsia, polyuria.  Some mild dysuria, improved.  No vaginal discharge or bleeding.  Small spot in R back hurts occasionally.  Denies abdominal pain, or bleeding of any kind.  PHYSICAL EXAM: BP 118/76  Pulse 68  Ht 5\' 6"  (1.676 m)  Wt 169 lb (76.658 kg)  BMI 27.28 kg/m2 Well developed, pleasant female, in no distress Remainder of exam limited to discussion, counseling, and review of labs.  Labs reviewed in detail.  Average risk hs-CRP in 03/2011.  Most recent B12 level was normal, Hg 15.  Reviewed glucose, A1c, and lipids in detail.  ASSESSMENT/PLAN: 1. Impaired fasting glucose   2. Pure  hypercholesterolemia   3. B12 deficiency   Dysuria--most likely related to atrophic vaginitis, recurrent since off HRT.  Recommend retrying vaginal/topical estrogen.  Discussed risks, side effect, and that she can use short term, and restart prn recurrent symptoms.     6 months--[facility] diagnostic panel.  Check if covered 6 mos after liposcience Needs hs-CRP, lipids, A1c, fasting glucose Send copies of lab results to Dr. Tresa Endo  30 minute visit, all face to face, counseling.

## 2012-09-17 ENCOUNTER — Ambulatory Visit: Payer: Self-pay | Admitting: Unknown Physician Specialty

## 2012-09-24 ENCOUNTER — Ambulatory Visit: Payer: Self-pay | Admitting: Unknown Physician Specialty

## 2012-11-14 ENCOUNTER — Ambulatory Visit (INDEPENDENT_AMBULATORY_CARE_PROVIDER_SITE_OTHER): Payer: 59 | Admitting: Medical

## 2012-11-14 ENCOUNTER — Encounter: Payer: Self-pay | Admitting: Medical

## 2012-11-14 VITALS — BP 110/80 | HR 68 | Temp 100.9°F | Resp 16 | Wt 149.0 lb

## 2012-11-14 DIAGNOSIS — J069 Acute upper respiratory infection, unspecified: Secondary | ICD-10-CM

## 2012-11-14 DIAGNOSIS — R509 Fever, unspecified: Secondary | ICD-10-CM

## 2012-11-14 NOTE — Progress Notes (Signed)
Subjective: Here for illness.  Here for 2 day hx/o facial pain, teeth pain, face pressure, headache, and today started having a cough.  Was out in the cold wet weather over the weekend for a period of time .  This is usually a set up for sinus infection for her.  No sore throat, nausea, vomiting, diarrhea, ear pain.  No production with cough.   Some yellow nasal discharge first thing this morning.  Does feel achy starting today.  Chills+.  No sick contacts.   Using Tylenol.  symptoms came on gradually, and facial pressure a little better today than yesterday.  Past Medical History  Diagnosis Date  . Vitamin D deficiency   . IBS (irritable bowel syndrome) constipation(dr elliot [place])  . RLS (restless legs syndrome)   . GERD (gastroesophageal reflux disease) HH per pt EGD nl 2/06  . Colon polyps   . Osteopenia hx  . Elevated cholesterol   . OSA (obstructive sleep apnea) moderate, not using CPAP, aware of risks    2007  . Depression panic(dr.william chen)  . Anxiety     GENERALIZED/WILLIAM CHEN MD  . Hypothyroid    ROS as in HPI   Objective:   Filed Vitals:   11/14/12 1446  BP: 110/80  Pulse: 68  Temp: 100.9 F (38.3 C)  Resp: 16    General appearance: Alert, WD/WN, no distress, mildly ill appearing                             Skin: warm, no rash                           Head: mild maxillary sinus tenderness                            Eyes: conjunctiva normal, corneas clear, PERRLA                            Ears: pearly TMs, external ear canals normal                          Nose: septum midline, turbinates swollen, with erythema and clear discharge             Mouth/throat: MMM, tongue normal, mild pharyngeal erythema                           Neck: supple, no adenopathy, no thyromegaly, nontender                          Heart: RRR, normal S1, S2, no murmurs                         Lungs: CTA bilaterally, no wheezes, rales, or rhonchi     Assessment and Plan:    Encounter Diagnoses  Name Primary?  . URI (upper respiratory infection) Yes  . Fever     Current symptoms and exam suggest viral URI.  Flu swab negative today.   discussed diagnosis and treatment of URI vs flu or sinus infection.  Suggested symptomatic OTC remedies.  Nasal saline spray for congestion.  OTC Mucinex or Mucinex DM.   Ibuprofen OTC for fever and  malaise.  Call/return in 2-3 days if symptoms aren't resolving.

## 2012-11-14 NOTE — Addendum Note (Signed)
Addended by: Janeice Robinson on: 11/14/2012 03:54 PM   Modules accepted: Orders

## 2012-12-04 ENCOUNTER — Other Ambulatory Visit: Payer: Self-pay | Admitting: Family Medicine

## 2012-12-15 ENCOUNTER — Other Ambulatory Visit: Payer: Self-pay | Admitting: Family Medicine

## 2012-12-25 ENCOUNTER — Telehealth: Payer: Self-pay | Admitting: *Deleted

## 2012-12-25 ENCOUNTER — Other Ambulatory Visit: Payer: 59

## 2012-12-25 ENCOUNTER — Other Ambulatory Visit: Payer: Self-pay | Admitting: Family Medicine

## 2012-12-25 DIAGNOSIS — E7800 Pure hypercholesterolemia, unspecified: Secondary | ICD-10-CM

## 2012-12-25 DIAGNOSIS — R7301 Impaired fasting glucose: Secondary | ICD-10-CM

## 2012-12-25 LAB — LIPID PANEL
Cholesterol: 203 mg/dL — ABNORMAL HIGH (ref 0–200)
HDL: 58 mg/dL (ref >39)
Total CHOL/HDL Ratio: 3.5 Ratio
Triglycerides: 93 mg/dL (ref <150)

## 2012-12-25 NOTE — Telephone Encounter (Signed)
We were only able to do CRP hs, lipids, A1C and glucose. Liposcience is a separate tube as is [employer].

## 2012-12-25 NOTE — Telephone Encounter (Signed)
She will need to return for redraw.  Might as well use [facility]oscience rather than [facility] since other tests were done [facility] would have done all of them, ie CRP, A1c, etc.).  In future, please do NOT have labs scheduled without orders--I would have made sure that Stefan Church was aware that additional tubes were needed if I was asked for orders prior to appointment.  I also would have let patient know that results take much longer than 2 days to get results from either [facility] or [facility].  You can see if she prefers to reschedule until after labs are back.

## 2012-12-25 NOTE — Telephone Encounter (Signed)
See bottom of last OV with me where recommended tests were put (supposed to be [facility] not drawn in those tubes, go ahead and do liposcience instead)

## 2012-12-25 NOTE — Telephone Encounter (Signed)
Patient was scheduled for fasting labs this morning and is to follow up with you on Thursday, I need to know what labs to order please. Thanks.

## 2012-12-26 LAB — HIGH SENSITIVITY CRP: CRP, High Sensitivity: 0.7 mg/L

## 2012-12-26 LAB — HEMOGLOBIN A1C: Mean Plasma Glucose: 123 mg/dL — ABNORMAL HIGH (ref <117)

## 2012-12-27 ENCOUNTER — Encounter: Payer: Self-pay | Admitting: Family Medicine

## 2012-12-27 ENCOUNTER — Ambulatory Visit (INDEPENDENT_AMBULATORY_CARE_PROVIDER_SITE_OTHER): Payer: 59 | Admitting: Family Medicine

## 2012-12-27 VITALS — BP 118/72 | HR 68 | Ht 66.0 in | Wt 148.0 lb

## 2012-12-27 DIAGNOSIS — E039 Hypothyroidism, unspecified: Secondary | ICD-10-CM

## 2012-12-27 DIAGNOSIS — R7301 Impaired fasting glucose: Secondary | ICD-10-CM

## 2012-12-27 DIAGNOSIS — E7800 Pure hypercholesterolemia, unspecified: Secondary | ICD-10-CM

## 2012-12-27 NOTE — Progress Notes (Signed)
Chief Complaint  Patient presents with  . Follow-up    6 month follow up and to go over labs.   Patient presents for follow-up on labs.  She has been under care of Durwin Nora, although she hasn't actually seen her since November (due to illnesses, cancellations).   She has lost 30 pounds since the summer. Stable at current weight since November.  She hasn't had any soda, hardly any bread (other than "rounds"), eats [employer] pasta (lower in carbs), as well as more frequent exercise.  She feels good.  She brings in [facility] lab results form 09/2012 ordered by Dr. Nicholaus Bloom: total 221, HDL 50, TG 77, LDL 156, chol/HDL ratio 4.4, LDL particle size 226.2 (normal, A pattern).  Total LDL particles 2321 (high); LDL very small 578 (high), LDL, med and small 623 (high), LDL large 10,107 (normal).  Cardio CRP 0.9.  Normal vitamin D, normal insulin.  She was reassured that she didn't have genetic risk factors.  Statins were recommended by Dr. Nicholaus Bloom, declined by pt.  He also suggested Zetia, but she never started it.  She changed how she has taken her synthroid, now taking it properly, on empty stomach without other meds.  Denies any thyroid symptoms.  Past Medical History  Diagnosis Date  . Vitamin D deficiency   . IBS (irritable bowel syndrome) constipation(dr elliot [place])  . RLS (restless legs syndrome)   . GERD (gastroesophageal reflux disease) HH per pt EGD nl 2/06  . Colon polyps   . Osteopenia hx  . Elevated cholesterol   . OSA (obstructive sleep apnea) moderate, not using CPAP, aware of risks    2007  . Depression panic(dr.william chen)  . Anxiety     GENERALIZED/WILLIAM CHEN MD  . Hypothyroid    Past Surgical History  Procedure Date  . Abdominal hysterectomy 1999    and BSO (for benign reasons)  . Colonoscopy 2011    Dr. Mechele Collin [facility] [place])  . Appendectomy 1999  . Esophagogastroduodenoscopy 2009   History   Social History  . Marital Status:  Married    Spouse Name: N/A    Number of Children: 1  . Years of Education: N/A   Occupational History  . homemaker    Social History Main Topics  . Smoking status: Never Smoker   . Smokeless tobacco: Never Used  . Alcohol Use: Yes     Comment: socially, 1 glass wine bi-weekly  . Drug Use: No  . Sexually Active: Yes -- Female partner(s)   Other Topics Concern  . Not on file   Social History Narrative   Lives with husband, dog   Current Outpatient Prescriptions on File Prior to Visit  Medication Sig Dispense Refill  . Cholecalciferol (VITAMIN D) 2000 UNITS tablet Take 2,000 Units by mouth daily.        Marland Kitchen desvenlafaxine (PRISTIQ) 50 MG 24 hr tablet Take 50 mg by mouth daily.        Marland Kitchen L-Methylfolate (DEPLIN PO) Take by mouth.      . levothyroxine (LEVOXYL) 25 MCG tablet Take 1 tablet (25 mcg total) by mouth daily.  90 tablet  3  . Magnesium 250 MG TABS Take 1 tablet by mouth daily.        Marland Kitchen conjugated estrogens (PREMARIN) vaginal cream Use daily for first week and then 3 times per week.  42.5 g  1  . LORazepam (ATIVAN) 0.5 MG tablet Take 0.5 mg by mouth as needed.        Marland Kitchen  ranitidine (ZANTAC) 150 MG tablet Take 150 mg by mouth as needed.         Allergies  Allergen Reactions  . Zithromax (Azithromycin) Hives   ROS:  Denies fevers, URI symptoms, headaches, dizziness, chest pain, palpitations, shortness of breath, GI complaints, joint complaints.  Denies fatigue, hair/skin changes, bowel changes, mood changes.  PHYSICAL EXAM: BP 118/72  Pulse 68  Ht 5\' 6"  (1.676 m)  Wt 148 lb (67.132 kg)  BMI 23.89 kg/m2 Well developed, pleasant female in no distress.  Significant weight loss noted since last visit--she looks great. Remainder of visit was limited to discussion, review of labs.  Lab results: Hs-CRP 0.7 (low risk) Lab Results  Component Value Date   CHOL 203* 12/25/2012   HDL 58 12/25/2012   LDLCALC 126* 12/25/2012   TRIG 93 12/25/2012   CHOLHDL 3.5 12/25/2012   Lab  Results  Component Value Date   HGBA1C 5.9* 12/25/2012   Glucose 89  ASSESSMENT/PLAN:  1. Pure hypercholesterolemia   2. Impaired fasting glucose    Impaired fasting glucose--sugar is normal, A1c is borderline, unchanged.  Continue exercise and lowcarb diet.  Reassured patient.  Hypothyroidism--euthyroid by history--plan to recheck TSH with next labs in 6 months  Hyperlipidemia--much improved with dietary changes.   Recheck labs in 6 months (hold off on starting Zetia until those labs done, as lipids are improved since last particle check).  If particle size/number still abnormal, will start Zetia at next visit. Reviewed risks/side effects of Zetia.  Will send copies of results to Dr. Nicholaus Bloom   TSH, A1c, c-met liposcience lipid profile.  ENTER ORDERS

## 2012-12-27 NOTE — Patient Instructions (Addendum)
  Hs-CRP 0.7 (low risk) Lab Results  Component Value Date   CHOL 203* 12/25/2012   HDL 58 12/25/2012   LDLCALC 126* 12/25/2012   TRIG 93 12/25/2012   CHOLHDL 3.5 12/25/2012   Lab Results  Component Value Date   HGBA1C 5.9* 12/25/2012   Glucose 89  Continue low cholesterol diet. I'd hold off on starting the Zetia until we recheck particle size/number; overall your lipids are significantly improved from 7 months ago  Let's check Liposcience lipid profile along with your other labs--please remind lab tech to draw special tube.

## 2012-12-28 ENCOUNTER — Encounter: Payer: Self-pay | Admitting: Family Medicine

## 2013-04-23 ENCOUNTER — Encounter: Payer: Self-pay | Admitting: Emergency Medicine

## 2013-04-25 ENCOUNTER — Ambulatory Visit: Payer: 59 | Admitting: Cardiovascular Disease

## 2013-05-06 ENCOUNTER — Ambulatory Visit: Payer: 59 | Admitting: Cardiovascular Disease

## 2013-05-09 ENCOUNTER — Other Ambulatory Visit: Payer: Self-pay | Admitting: Cardiovascular Disease

## 2013-05-10 LAB — NMR LIPOPROFILE WITH LIPIDS
HDL Size: 9.6 nm (ref >=9.2)
LDL Particle Number: 1578 nmol/L — ABNORMAL HIGH (ref <1000)
Large HDL-P: 9.8 umol/L (ref >=4.8)
Large VLDL-P: 1.4 nmol/L (ref <=2.7)
Small LDL Particle Number: 433 nmol/L (ref <=527)
VLDL Size: 44.3 nm (ref <=46.6)

## 2013-05-13 ENCOUNTER — Encounter: Payer: Self-pay | Admitting: Cardiovascular Disease

## 2013-05-14 ENCOUNTER — Ambulatory Visit (INDEPENDENT_AMBULATORY_CARE_PROVIDER_SITE_OTHER): Payer: 59 | Admitting: Cardiovascular Disease

## 2013-05-14 VITALS — BP 110/70 | HR 65 | Ht 67.0 in | Wt 153.4 lb

## 2013-05-14 DIAGNOSIS — Z8249 Family history of ischemic heart disease and other diseases of the circulatory system: Secondary | ICD-10-CM

## 2013-05-14 DIAGNOSIS — Z79899 Other long term (current) drug therapy: Secondary | ICD-10-CM

## 2013-05-14 DIAGNOSIS — E7800 Pure hypercholesterolemia, unspecified: Secondary | ICD-10-CM

## 2013-05-14 DIAGNOSIS — E785 Hyperlipidemia, unspecified: Secondary | ICD-10-CM

## 2013-05-14 DIAGNOSIS — E039 Hypothyroidism, unspecified: Secondary | ICD-10-CM

## 2013-05-14 MED ORDER — EZETIMIBE 10 MG PO TABS: 10.0000 mg | ORAL_TABLET | Freq: Every day | ORAL | Status: DC

## 2013-05-14 NOTE — Patient Instructions (Addendum)
Your physician recommends that you return for lab work in: 3-4 MONTHS  Your physician has recommended you make the following change in your medication: ZETIA 10 mg was added to your medications. This has already been sent to your pharmacy.  Your physician recommends that you schedule a follow-up appointment in: 4 months

## 2013-05-29 ENCOUNTER — Encounter: Payer: Self-pay | Admitting: Cardiovascular Disease

## 2013-05-29 NOTE — Progress Notes (Signed)
Patient ID: Stacy Carrillo, female   DOB: 26-Sep-1951, 62 y.o.   MRN: [id number]     HPI: Stacy Carrillo, is a 62 y.o. female who presents for followup Cardiologic evaluation of her hyperlipidemia. Stacy Carrillo has a strong family history for coronary artery disease as well as hyperlipidemia. In June 2013 her total cholesterol was 273 with an LDL of 186 HDL 16 and triglycerides 94. MMR and followup that time showed an LDL particle #1940. She has a history of hypothyroidism for which she's been on Synthroid replacement as well as migraine headaches. When I saw her in November 2013 [facility] heart lab assessment with genetic profile I recommended at least a trial of Xenical in light of her marked hyperlipidemia. He did not want to initiate therapy and sought nutritional guidance. She does admit to significantly increasing her exercise and had approximately a 30 pound weight loss over the past year. I did suggest that after continued dietary attempt repeat NMR lipoprofile should be obtained. This was done on 05/09/2012 her total cholesterol was still elevated at 222, HDL was excellent at 71 LDL was 137. Her LDL small particles were normal at 433 but she still had increased LDL particle number of 1578 nmol per liter. She denies any chest pain. She still admits to exercising. He has not lost anymore weight. Past Medical History  Diagnosis Date  . Vitamin D deficiency   . IBS (irritable bowel syndrome) constipation(dr elliot [place])  . RLS (restless legs syndrome)   . GERD (gastroesophageal reflux disease) HH per pt EGD nl 2/06  . Colon polyps   . Osteopenia hx  . Elevated cholesterol   . OSA (obstructive sleep apnea) moderate, not using CPAP, aware of risks    2007  . Depression panic(dr.william chen)  . Anxiety     GENERALIZED/WILLIAM CHEN MD  . Hypothyroid     Past Surgical History  Procedure Laterality Date  . Abdominal hysterectomy  1999    and BSO (for benign reasons)    . Colonoscopy  2011    Dr. Mechele Collin [facility] [place])  . Appendectomy  1999  . Esophagogastroduodenoscopy  2009    Allergies  Allergen Reactions  . Zithromax (Azithromycin) Hives    Current Outpatient Prescriptions  Medication Sig Dispense Refill  . b complex vitamins tablet Take 1 tablet by mouth daily.      . Cholecalciferol (VITAMIN D) 2000 UNITS tablet Take 2,000 Units by mouth daily.        . cloNIDine (CATAPRES) 0.1 MG tablet Take 0.1 mg by mouth daily as needed.      . desvenlafaxine (PRISTIQ) 50 MG 24 hr tablet Take 50 mg by mouth daily.        Marland Kitchen L-Methylfolate (DEPLIN PO) Take by mouth.      . levothyroxine (LEVOXYL) 25 MCG tablet Take 1 tablet (25 mcg total) by mouth daily.  90 tablet  3  . LORazepam (ATIVAN) 0.5 MG tablet Take 0.5 mg by mouth as needed.        . Magnesium 250 MG TABS Take 1 tablet by mouth daily.        . ranitidine (ZANTAC) 150 MG tablet Take 150 mg by mouth as needed.        . ezetimibe (ZETIA) 10 MG tablet Take 1 tablet (10 mg total) by mouth daily.  30 tablet  4   No current facility-administered medications for this visit.    Socially she is married  To Stacy Carrillo.  Days per week with a trainer 45 minutes at a time. Is no tobacco or alcohol use.  ROS is negative for fever chills night sweats. She denies myalgias. She denies PND or orthopnea. She denies chest pain. There is no snoring. She states she has always had some difficulty with sleep. She denies frequent awakenings. She denies residual daytime sleepiness. She denies edema. She denies bleeding. Other system review is negative.  PE BP 110/70  Pulse 65  Ht 5\' 7"  (1.702 m)  Wt 153 lb 6.4 oz (69.582 kg)  BMI 24.02 kg/m2  General: Alert, oriented, no distress.  Skin: normal turgor, no rashes HEENT: Normocephalic, atraumatic. Pupils round and reactive; sclera anicteric;no lid lag,  Nose without nasal septal hypertrophy Mouth/Parynx benign; Mallinpatti scale 2 Neck: No JVD, no  carotid briuts Lungs: clear to ausculatation and percussion; no wheezing or rales Heart: RRR, s1 s2 normal; whiff of a systolic murmur. Abdomen: soft, nontender; no hepatosplenomehaly, BS+; abdominal aorta nontender and not dilated by palpation. Pulses 2+ Extremities: no clubbing cyanosis or edema, Homan's sign negative  Neurologic: grossly nonfocal  ECG: Normal sinus rhythm at 65 beats per minute   LABS:  BMET    Component Value Date/Time   NA 137 05/24/2012 1204   K 4.7 05/24/2012 1204   CL 103 05/24/2012 1204   CO2 24 05/24/2012 1204   GLUCOSE 89 12/25/2012 1444   BUN 17 05/24/2012 1204   CREATININE 1.24* 05/24/2012 1204   CALCIUM 9.5 05/24/2012 1204     Hepatic Function Panel     Component Value Date/Time   PROT 7.2 05/24/2012 1204   ALBUMIN 4.6 05/24/2012 1204   AST 20 05/24/2012 1204   ALT 13 05/24/2012 1204   ALKPHOS 64 05/24/2012 1204   BILITOT 0.6 05/24/2012 1204     CBC    Component Value Date/Time   WBC 5.6 05/24/2012 1204   RBC 4.92 05/24/2012 1204   HGB 15.0 05/24/2012 1204   HCT 45.2 05/24/2012 1204   PLT 248 05/24/2012 1204   MCV 91.9 05/24/2012 1204   MCH 30.5 05/24/2012 1204   MCHC 33.2 05/24/2012 1204   RDW 13.6 05/24/2012 1204   LYMPHSABS 1.8 05/24/2012 1204   MONOABS 0.6 05/24/2012 1204   EOSABS 0.1 05/24/2012 1204   BASOSABS 0.1 05/24/2012 1204     BNP No results found for this basename: probnp    Lipid Panel     Component Value Date/Time   CHOL 203* 12/25/2012 1444   TRIG 70 05/09/2013 1117   TRIG 93 12/25/2012 1444   HDL 58 12/25/2012 1444   CHOLHDL 3.5 12/25/2012 1444   VLDL 19 12/25/2012 1444   LDLCALC 137* 05/09/2013 1117   LDLCALC 126* 12/25/2012 1444     RADIOLOGY: No results found.    ASSESSMENT AND PLAN: I had a long discussion with Stacy Carrillo viewed her MMR lipoprotein after much discussion, I of her strong family history for coronary artery disease she has agreed to check a trial of Zetia10 mg. We will repeat laboratory in 3-4 months  with comprehensive metabolic panel and an NMR profile. I will see her in followup and additional recommendations will be made at that time.     Lennette Bihari, MD, [facility]  05/29/2013 11:29 PM

## 2013-06-12 ENCOUNTER — Other Ambulatory Visit: Payer: Self-pay | Admitting: Family Medicine

## 2013-06-18 ENCOUNTER — Other Ambulatory Visit: Payer: 59

## 2013-06-19 ENCOUNTER — Other Ambulatory Visit: Payer: Self-pay | Admitting: Family Medicine

## 2013-07-03 ENCOUNTER — Other Ambulatory Visit: Payer: 59

## 2013-07-03 ENCOUNTER — Telehealth: Payer: Self-pay | Admitting: *Deleted

## 2013-07-03 DIAGNOSIS — E039 Hypothyroidism, unspecified: Secondary | ICD-10-CM

## 2013-07-03 DIAGNOSIS — R7301 Impaired fasting glucose: Secondary | ICD-10-CM

## 2013-07-03 DIAGNOSIS — E7800 Pure hypercholesterolemia, unspecified: Secondary | ICD-10-CM

## 2013-07-03 LAB — COMPREHENSIVE METABOLIC PANEL
ALT: 20 U/L (ref 0–35)
CO2: 27 mEq/L (ref 19–32)
Calcium: 9 mg/dL (ref 8.4–10.5)
Chloride: 107 mEq/L (ref 96–112)
Creat: 1 mg/dL (ref 0.50–1.10)
Glucose, Bld: 95 mg/dL (ref 70–99)
Sodium: 138 mEq/L (ref 135–145)
Total Protein: 6.5 g/dL (ref 6.0–8.3)

## 2013-07-03 LAB — TSH: TSH: 2.602 u[IU]/mL (ref 0.350–4.500)

## 2013-07-03 LAB — HEMOGLOBIN A1C: Mean Plasma Glucose: 111 mg/dL (ref <117)

## 2013-07-03 NOTE — Telephone Encounter (Signed)
Patient was in for labs this am, NMR lipoprofile was in future orders, I released order. Patient told Stefan Church that she just recently had particle size done at Dr.Kelley's (cards) office and she could bring to appointment tomorrow, is this okay? Please advise. Thanks.

## 2013-07-03 NOTE — Telephone Encounter (Signed)
I received notes from Dr. Nicholaus Bloom. He checked her lipids, particle size, and started her on Zetia. This does not need to be repeated (lipids/NMR)

## 2013-07-04 ENCOUNTER — Ambulatory Visit (INDEPENDENT_AMBULATORY_CARE_PROVIDER_SITE_OTHER): Payer: 59 | Admitting: Family Medicine

## 2013-07-04 ENCOUNTER — Encounter: Payer: Self-pay | Admitting: Family Medicine

## 2013-07-04 VITALS — BP 118/72 | HR 76 | Ht 66.25 in | Wt 155.0 lb

## 2013-07-04 DIAGNOSIS — Z23 Encounter for immunization: Secondary | ICD-10-CM

## 2013-07-04 DIAGNOSIS — Z0000 Encounter for general adult medical examination without abnormal findings: Secondary | ICD-10-CM

## 2013-07-04 DIAGNOSIS — M8580 Other specified disorders of bone density and structure, unspecified site: Secondary | ICD-10-CM

## 2013-07-04 DIAGNOSIS — R7301 Impaired fasting glucose: Secondary | ICD-10-CM

## 2013-07-04 DIAGNOSIS — E7800 Pure hypercholesterolemia, unspecified: Secondary | ICD-10-CM

## 2013-07-04 DIAGNOSIS — N952 Postmenopausal atrophic vaginitis: Secondary | ICD-10-CM

## 2013-07-04 DIAGNOSIS — Z780 Asymptomatic menopausal state: Secondary | ICD-10-CM

## 2013-07-04 DIAGNOSIS — Z2911 Encounter for prophylactic immunotherapy for respiratory syncytial virus (RSV): Secondary | ICD-10-CM

## 2013-07-04 DIAGNOSIS — M899 Disorder of bone, unspecified: Secondary | ICD-10-CM

## 2013-07-04 DIAGNOSIS — E039 Hypothyroidism, unspecified: Secondary | ICD-10-CM

## 2013-07-04 LAB — POCT URINALYSIS DIPSTICK
Blood, UA: NEGATIVE
Nitrite, UA: NEGATIVE
Urobilinogen, UA: NEGATIVE
pH, UA: 6

## 2013-07-04 NOTE — Progress Notes (Signed)
Chief Complaint  Patient presents with  . Annual Exam    annual physical with pelvic, has not found new GYN yet. Nails are brittle and ridged and peel. Has hearing loss and thinks it may be getting worse, wants to know where she should go. Has noticed that she is bruising lately. Also states that Dr.Rohnan Bartleson was supposed to check on a medication for painful intercourse called...Marland Kitchenospemifene.   Stacy Carrillo is a 62 y.o. female who presents for a complete physical.  She has the following concerns:  Hyperlipidemia:  Saw Dr. Nicholaus Bloom recently, and had liposcience profile done.  Zetia was prescribed, but hasn't been started yet. She took for a couple of days, didn't have side effects, just didn't get in a routine yet.  She is a little concerned that [place] (nutritionist) will be upset that she was put on medication.  She is concerned that she has regained some weight.  She hasn't seen Durwin Nora in about 3 months. She has slacked just a little in her diet, continuing to get a lot of exercise.  She was hit in head with baseball at [employer] [place] 3 weeks ago.  No LOC, headache lasted x 3-4 days, resolved.  Couldn't exercise for a week.  Vaginal dryness.  She stopped HRT 1-2 years ago due to length of time she had been on meds.  She had decreased the dose, and symptoms were a little worse on the lower dose, but much worse since being off.  Has occasional hot flash at night. She is asking about Osphena.  Has pain with intercourse.   Health Maintenance: Immunization History  Administered Date(s) Administered  . Influenza Split 11/26/2012  . Tdap 07/04/2013  . Zoster 07/04/2013   Tetanus approximately 5 years ago, ?type.  Reviewed records provided by [facility], back to 2009, no mention of the visit for suture removal so likely was earlier (which was f/u ER visit when pt thinks she got tetanus).   Last Pap smear: 08/2011  Last mammogram: 08/2011 (did not get last year) Last  colonoscopy: 2011 Dr. Mechele Collin in [place]  Last DEXA: 6 years ago, ?slight abnormal, through GYN in [place] Dentist: twice yearly  Ophtho: yearly  Exercise: works out with trainer 3 days/week, biking, swimming, kayaking, exercising 5d/week  Past Medical History  Diagnosis Date  . Vitamin D deficiency   . IBS (irritable bowel syndrome) constipation(dr elliot [place])  . RLS (restless legs syndrome)   . GERD (gastroesophageal reflux disease) HH per pt EGD nl 2/06  . Colon polyps   . Osteopenia hx  . Elevated cholesterol   . OSA (obstructive sleep apnea) moderate, not using CPAP, aware of risks    2007  . Depression panic(dr.william chen)  . Anxiety     GENERALIZED/WILLIAM CHEN MD  . Hypothyroid     Past Surgical History  Procedure Laterality Date  . Abdominal hysterectomy  1999    and BSO (for benign reasons)  . Colonoscopy  2011    Dr. Mechele Collin [facility] [place])  . Appendectomy  1999  . Esophagogastroduodenoscopy  2009    History   Social History  . Marital Status: Married    Spouse Name: N/A    Number of Children: 1  . Years of Education: N/A   Occupational History  . homemaker    Social History Main Topics  . Smoking status: Never Smoker   . Smokeless tobacco: Never Used  . Alcohol Use: No     Comment: socially, 1 glass wine  bi-weekly  . Drug Use: No  . Sexually Active: Yes -- Female partner(s)   Other Topics Concern  . Not on file   Social History Narrative   Lives with husband, dog.  Son lives in [place]    Family History  Problem Relation Age of Onset  . Heart disease Mother   . Cancer Father     stomach and liver  . Sarcoidosis Sister   . Heart disease Brother 68  . Heart disease Maternal Aunt   . Heart disease Paternal Aunt   . Heart disease Paternal Uncle   . Stroke Neg Hx   . Colon cancer Neg Hx   . Depression Brother   . Breast cancer Cousin     50's    Current outpatient prescriptions:b complex vitamins tablet, Take  1 tablet by mouth daily., Disp: , Rfl: ;  Cholecalciferol (VITAMIN D) 2000 UNITS tablet, Take 2,000 Units by mouth daily.  , Disp: , Rfl: ;  desvenlafaxine (PRISTIQ) 50 MG 24 hr tablet, Take 50 mg by mouth daily.  , Disp: , Rfl: ;  L-Methylfolate (DEPLIN PO), Take by mouth., Disp: , Rfl:  levothyroxine (SYNTHROID, LEVOTHROID) 25 MCG tablet, TAKE 1 TABLET BY MOUTH EVERY DAY, Disp: 30 tablet, Rfl: 5;  LORazepam (ATIVAN) 0.5 MG tablet, Take 0.5 mg by mouth as needed.  , Disp: , Rfl: ;  Magnesium 250 MG TABS, Take 1 tablet by mouth daily.  , Disp: , Rfl: ;  ezetimibe (ZETIA) 10 MG tablet, Take 1 tablet (10 mg total) by mouth daily., Disp: 30 tablet, Rfl: 4 ranitidine (ZANTAC) 150 MG tablet, Take 150 mg by mouth as needed.  , Disp: , Rfl:   Allergies  Allergen Reactions  . Zithromax (Azithromycin) Hives   ROS: The patient denies anorexia, fever, headaches (rare, much better); denies vision changes, ear pain, sore throat, breast concerns, chest pain, palpitations, dizziness, syncope, dyspnea on exertion, cough, swelling, nausea, vomiting, diarrhea, constipation, abdominal pain, melena, hematochezia, indigestion/heartburn, hematuria, incontinence, vaginal bleeding, discharge, odor or itch, genital lesions, joint pains, numbness, tingling, weakness, tremor, suspicious skin lesions, depression, abnormal bleeding/bruising, or enlarged lymph nodes.  Some knee pain, intermittent.  Better since weight loss. Some hearing loss.  Had check 3 years ago; family is starting to notice more. Chronic tinnitus Vaginal dryness and painful intercourse Nails peeling, more brittle   PHYSICAL EXAM: BP 118/72  Pulse 76  Ht 5' 6.25" (1.683 m)  Wt 155 lb (70.308 kg)  BMI 24.82 kg/m2  General Appearance:  Alert, cooperative, no distress, appears stated age   Head:  Normocephalic, without obvious abnormality, atraumatic   Eyes:  PERRL, conjunctiva/corneas clear, EOM's intact, fundi  benign   Ears:  Normal TM's and external  ear canals; mod cerumen in R ear, L is clear  Nose:  Nares normal, mucosa normal, no drainage or sinus tenderness   Throat:  Lips, mucosa, and tongue normal; teeth and gums normal   Neck:  Supple, no lymphadenopathy; thyroid: no enlargement/tenderness/nodules; no carotid  bruit or JVD   Back:  Spine nontender, no curvature, ROM normal, no CVA tenderness.    Lungs:  Clear to auscultation bilaterally without wheezes, rales or ronchi; respirations unlabored   Chest Wall:  No tenderness or deformity   Heart:  Regular rate and rhythm, S1 and S2 normal, no murmur, rub  or gallop   Breast Exam:  No nipple inversion, nipple discharge. No dominant masses or axillary lymphadenopathy.   Abdomen:  Soft, non-tender, nondistended, normoactive bowel  sounds,  no masses, no hepatosplenomegaly   Genitalia:  normal external genitalia, with only mild atrophic changes. Bimanual exam is normal--no appreciable masses. Uterus and ovaries surgically absent.    Rectal exam--normal sphincter tone, brown, heme negative stool, no mass   Extremities:  No clubbing, cyanosis or edema   Pulses:  2+ and symmetric all extremities   Skin:  Skin color, texture, turgor normal, no rashes or lesions   Lymph nodes:  Cervical, supraclavicular, and axillary nodes normal   Neurologic:  CNII-XII intact, normal strength, sensation and gait; reflexes 2+ and symmetric throughout          Psych: Normal mood, affect, hygiene and grooming.    Lab Results  Component Value Date   HGBA1C 5.5 07/03/2013     Chemistry      Component Value Date/Time   NA 138 07/03/2013 0928   K 4.1 07/03/2013 0928   CL 107 07/03/2013 0928   CO2 27 07/03/2013 0928   BUN 18 07/03/2013 0928   CREATININE 1.00 07/03/2013 0928      Component Value Date/Time   CALCIUM 9.0 07/03/2013 0928   ALKPHOS 45 07/03/2013 0928   AST 25 07/03/2013 0928   ALT 20 07/03/2013 0928   BILITOT 0.5 07/03/2013 0928    Glucose 95  Lab Results  Component Value Date   TSH 2.602  07/03/2013   ASSESSMENT/PLAN:  Routine general medical examination at a health care facility - Plan: Visual acuity screening, POCT Urinalysis Dipstick  Need for Tdap vaccination - Plan: Tdap vaccine greater than or equal to 7yo IM  Need for shingles vaccine - Plan: Varicella-zoster vaccine subcutaneous  Unspecified hypothyroidism - adequately replaced.  Reassured that nail changes are not related to thyroid.  discussed vitamins, can further discuss with nutritionist  Pure hypercholesterolemia - start Zetia as per Dr. Nicholaus Bloom. continue lowfat, low cholesterol diet  Impaired fasting glucose - improved/resolved with weight loss  Postmenopausal atrophic vaginitis - discussed risks/benefits of Osphena and HRT in general.  recommended trial of topical vaginal estrogen   Postmenopausal - Plan: DG Bone Density  Osteopenia - Plan: DG Bone Density She needs to get mammogram before estrogen will be prescribed.  She will call after she gets mammogram (she gets at [facility]), to ensure that we see results, then will call in rx.  Discussed using premarin vaginal cream 2x/week, but may need it more frequently for the first couple of weeks, then back down to 2x/wk if able for maintenance.    F/u hearing exam recommended; recommended that she use OTC treatment to clear wax from right ear prior to eval  Pt to call with pharmacy name after she gets her mammogram If changes mind, and prefers ophena, she will let us know  Risks/benefits of zostavax reviewed.

## 2013-07-04 NOTE — Patient Instructions (Addendum)
HEALTH MAINTENANCE RECOMMENDATIONS:  It is recommended that you get at least 30 minutes of aerobic exercise at least 5 days/week (for weight loss, you may need as much as 60-90 minutes). This can be any activity that gets your heart rate up. This can be divided in 10-15 minute intervals if needed, but try and build up your endurance at least once a week.  Weight bearing exercise is also recommended twice weekly.  Eat a healthy diet with lots of vegetables, fruits and fiber.  "Colorful" foods have a lot of vitamins (ie green vegetables, tomatoes, red peppers, etc).  Limit sweet tea, regular sodas and alcoholic beverages, all of which has a lot of calories and sugar.  Up to 1 alcoholic drink daily may be beneficial for women (unless trying to lose weight, watch sugars).  Drink a lot of water.  Calcium recommendations are 1200-1500 mg daily (1500 mg for postmenopausal women or women without ovaries), and vitamin D 1000 IU daily.  This should be obtained from diet and/or supplements (vitamins), and calcium should not be taken all at once, but in divided doses.  Monthly self breast exams and yearly mammograms for women over the age of 36 is recommended.  Sunscreen of at least SPF 30 should be used on all sun-exposed parts of the skin when outside between the hours of 10 am and 4 pm (not just when at beach or pool, but even with exercise, golf, tennis, and yard work!)  Use a sunscreen that says "broad spectrum" so it covers both UVA and UVB rays, and make sure to reapply every 1-2 hours.  Remember to change the batteries in your smoke detectors when changing your clock times in the spring and fall.  Use your seat belt every time you are in a car, and please drive safely and not be distracted with cell phones and texting while driving.  Please schedule your mammogram! Follow up with Cherly Hensen to review diet, give some variety.  Your weight is healthy, but want to maintain where you are at, not gain  more.  Glucose 95  Lab Results  Component Value Date   HGBA1C 5.5 07/03/2013     Chemistry      Component Value Date/Time   NA 138 07/03/2013 0928   K 4.1 07/03/2013 0928   CL 107 07/03/2013 0928   CO2 27 07/03/2013 0928   BUN 18 07/03/2013 0928   CREATININE 1.00 07/03/2013 0928      Component Value Date/Time   CALCIUM 9.0 07/03/2013 0928   ALKPHOS 45 07/03/2013 0928   AST 25 07/03/2013 0928   ALT 20 07/03/2013 0928   BILITOT 0.5 07/03/2013 0928     Lab Results  Component Value Date   TSH 2.602 07/03/2013

## 2013-07-05 DIAGNOSIS — N952 Postmenopausal atrophic vaginitis: Secondary | ICD-10-CM | POA: Insufficient documentation

## 2013-07-11 ENCOUNTER — Other Ambulatory Visit: Payer: Self-pay | Admitting: Family Medicine

## 2013-07-22 ENCOUNTER — Encounter: Payer: Self-pay | Admitting: Internal Medicine

## 2013-07-22 ENCOUNTER — Ambulatory Visit: Payer: Self-pay

## 2013-07-22 LAB — HM MAMMOGRAPHY

## 2013-08-14 ENCOUNTER — Telehealth: Payer: Self-pay | Admitting: *Deleted

## 2013-08-14 DIAGNOSIS — N952 Postmenopausal atrophic vaginitis: Secondary | ICD-10-CM

## 2013-08-14 MED ORDER — ESTROGENS, CONJUGATED 0.625 MG/GM VA CREA: TOPICAL_CREAM | VAGINAL | Status: DC

## 2013-08-14 NOTE — Telephone Encounter (Signed)
Patient called and stated that when she was here for CPE 07/04/13 that premarin vaginal cream could possibly be prescribed after she had a mammogram and you have received the results. She called and states that she did have that done last month and results were normal. Is it possible to call in the cream? Please advise. Thanks.

## 2013-08-14 NOTE — Telephone Encounter (Signed)
Patient informed. 

## 2013-08-14 NOTE — Telephone Encounter (Signed)
Advise pt that rx was sent to her pharmacy.  Instructions say to use twice weekly--this is the maintenance dose.  She can use it daily for the first few weeks if needed, to help get symptoms under better control

## 2013-10-10 ENCOUNTER — Other Ambulatory Visit: Payer: Self-pay

## 2013-11-14 ENCOUNTER — Encounter: Payer: Self-pay | Admitting: Family Medicine

## 2013-11-14 ENCOUNTER — Ambulatory Visit (INDEPENDENT_AMBULATORY_CARE_PROVIDER_SITE_OTHER): Payer: 59 | Admitting: Family Medicine

## 2013-11-14 VITALS — BP 102/72 | HR 80 | Temp 98.3°F | Ht 66.25 in | Wt 158.0 lb

## 2013-11-14 DIAGNOSIS — J069 Acute upper respiratory infection, unspecified: Secondary | ICD-10-CM

## 2013-11-14 DIAGNOSIS — Z23 Encounter for immunization: Secondary | ICD-10-CM

## 2013-11-14 DIAGNOSIS — J029 Acute pharyngitis, unspecified: Secondary | ICD-10-CM

## 2013-11-14 NOTE — Patient Instructions (Signed)
Drink plenty of fluids. Use claritin, zyrtec or allegra. Consider using Mucinex (plain or DM) Consider sinus rinses Use tylenol or ibuprofen or aleve as needed for pain.  Call if sinus pain worsens, discolored mucus develops and persists, or fevers and symptoms lasting >7-10 days for an antibiotic

## 2013-11-14 NOTE — Progress Notes (Signed)
Chief Complaint  Patient presents with  . Hoarse    x 1 week. Feels like something is stuck in her throat. She wonders if it could be acid reflux. Dry scratchy cough. All meds reconciled.   Throat feels tight, voice is hoarse. She is coughing some, sometimes feels a tickle in her throat. She is doing frequent throat-clearing.  She has some left-sided sinus pressure.  Denies runny nose, ear pain, no pain with swallowing.  Throat feels dry.  Denies fevers, sick contact.  She has h/o reflux, but has been off Nexium for 5 years.  Denies any heartburn, belching.  Doesn't feel like what she had when she had reflux.  Last week she ate shredded wheat with bran, and her stomach felt like she had rocks, had pain, like she needed to double over. She took zantac and symptoms improved.  Hoarseness started the next day, so is wondering if could be related to reflux.  Past Medical History  Diagnosis Date  . Vitamin D deficiency   . IBS (irritable bowel syndrome) constipation(dr elliot [place])  . RLS (restless legs syndrome)   . GERD (gastroesophageal reflux disease) HH per pt EGD nl 2/06  . Colon polyps   . Osteopenia hx  . Elevated cholesterol   . OSA (obstructive sleep apnea) moderate, not using CPAP, aware of risks    2007  . Depression panic(dr.william chen)  . Anxiety     GENERALIZED/WILLIAM CHEN MD  . Hypothyroid    Past Surgical History  Procedure Laterality Date  . Abdominal hysterectomy  1999    and BSO (for benign reasons)  . Colonoscopy  2011    Dr. Mechele Collin [facility] [place])  . Appendectomy  1999  . Esophagogastroduodenoscopy  2009   History   Social History  . Marital Status: Married    Spouse Name: N/A    Number of Children: 1  . Years of Education: N/A   Occupational History  . homemaker    Social History Main Topics  . Smoking status: Never Smoker   . Smokeless tobacco: Never Used  . Alcohol Use: No     Comment: socially, 1 glass wine bi-weekly  .  Drug Use: No  . Sexual Activity: Yes    Partners: Male   Other Topics Concern  . Not on file   Social History Narrative   Lives with husband, dog.  Son lives in [place]    Current outpatient prescriptions:desvenlafaxine (PRISTIQ) 50 MG 24 hr tablet, Take 50 mg by mouth daily.  , Disp: , Rfl: ;  ezetimibe (ZETIA) 10 MG tablet, Take 1 tablet (10 mg total) by mouth daily., Disp: 30 tablet, Rfl: 4;  L-Methylfolate (DEPLIN PO), Take by mouth., Disp: , Rfl: ;  levothyroxine (SYNTHROID, LEVOTHROID) 25 MCG tablet, TAKE 1 TABLET BY MOUTH EVERY DAY, Disp: 30 tablet, Rfl: 5 LORazepam (ATIVAN) 0.5 MG tablet, Take 0.5 mg by mouth as needed.  , Disp: , Rfl: ;  Magnesium 250 MG TABS, Take 1 tablet by mouth daily.  , Disp: , Rfl: ;  ranitidine (ZANTAC) 150 MG tablet, Take 150 mg by mouth as needed.  , Disp: , Rfl: ;  zolpidem (AMBIEN) 5 MG tablet, Take 5 mg by mouth at bedtime as needed. , Disp: , Rfl: ;  b complex vitamins tablet, Take 1 tablet by mouth daily., Disp: , Rfl:  Cholecalciferol (VITAMIN D) 2000 UNITS tablet, Take 2,000 Units by mouth daily.  , Disp: , Rfl: ;  conjugated estrogens (PREMARIN) vaginal  cream, Place one applicatorful per vagina twice weekly, Disp: 42.5 g, Rfl: 12 (she hasn't been taking her vitamins for the last couple of days, but using all rx meds).  Allergies  Allergen Reactions  . Zithromax [Azithromycin] Hives   ROS:  Denies fevers, chills, nausea, vomiting, diarrhea.  Abdominal pain just once as per HPI.  Denies chest pain, palpitations, belching, heartburn.  Denies urinary complaints, bleeding, bruising, rashes or other concerns.  PHYSICAL EXAM: BP 102/72  Pulse 80  Temp(Src) 98.3 F (36.8 C) (Oral)  Ht 5' 6.25" (1.683 m)  Wt 158 lb (71.668 kg)  BMI 25.30 kg/m2 Well developed, pleasant female in no distress.  +Throat clearing and occasional dry cough HEENT:  PERRL, EOMI, conjunctiva clear.  Nasal mucosa moderately edematous with clear mucus.  No erythema or  purulence Mildly tender over left frontal and maxillary sinus. OP--erythema posteriorly, L>R Neck: no lymphadenopathy or mass Heart: regular rate and rhythm Lungs: clear bilaterally Skin: no rash Neuro: alert and oriented, cranial nerves intact, normal gait. Psych: normal mood, affect, hygiene, grooming  ASSESSMENT/PLAN:  Acute upper respiratory infections of unspecified site  Acute pharyngitis  Need for prophylactic vaccination and inoculation against influenza - Plan: Flu Vaccine QUAD 36+ mos IM  Drink plenty of fluids. Use claritin, zyrtec or allegra. Consider using Mucinex (plain or DM) Consider sinus rinses Use tylenol or ibuprofen or aleve as needed for pain.  Call if sinus pain worsens, discolored mucus develops and persists, or fevers and symptoms lasting >7-10 days for an antibiotic

## 2014-01-08 ENCOUNTER — Other Ambulatory Visit: Payer: Self-pay | Admitting: Family Medicine

## 2014-01-08 DIAGNOSIS — E039 Hypothyroidism, unspecified: Secondary | ICD-10-CM

## 2014-01-08 NOTE — Telephone Encounter (Signed)
Is this okay to refill? Last visit was CPE in July 2014 and I wasn't sure if she was to return in 6 months or a year.

## 2014-01-17 ENCOUNTER — Other Ambulatory Visit: Payer: Self-pay | Admitting: *Deleted

## 2014-01-17 MED ORDER — EZETIMIBE 10 MG PO TABS: 10.0000 mg | ORAL_TABLET | Freq: Every day | ORAL | Status: DC

## 2014-01-20 ENCOUNTER — Other Ambulatory Visit: Payer: Self-pay | Admitting: *Deleted

## 2014-01-20 MED ORDER — EZETIMIBE 10 MG PO TABS: 10.0000 mg | ORAL_TABLET | Freq: Every day | ORAL | Status: DC

## 2014-05-13 ENCOUNTER — Other Ambulatory Visit: Payer: Self-pay | Admitting: Cardiovascular Disease

## 2014-05-13 NOTE — Telephone Encounter (Signed)
Rx was sent to pharmacy electronically. 

## 2014-05-14 ENCOUNTER — Encounter: Payer: Self-pay | Admitting: Sports Medicine

## 2014-05-14 ENCOUNTER — Ambulatory Visit (INDEPENDENT_AMBULATORY_CARE_PROVIDER_SITE_OTHER): Payer: 59 | Admitting: Sports Medicine

## 2014-05-14 VITALS — BP 135/77 | Ht 67.0 in | Wt 155.0 lb

## 2014-05-14 DIAGNOSIS — M25551 Pain in right hip: Secondary | ICD-10-CM

## 2014-05-14 DIAGNOSIS — S39011A Strain of muscle, fascia and tendon of abdomen, initial encounter: Secondary | ICD-10-CM

## 2014-05-14 DIAGNOSIS — M25559 Pain in unspecified hip: Secondary | ICD-10-CM

## 2014-05-14 DIAGNOSIS — IMO0002 Reserved for concepts with insufficient information to code with codable children: Secondary | ICD-10-CM

## 2014-05-14 DIAGNOSIS — M25552 Pain in left hip: Principal | ICD-10-CM

## 2014-05-14 NOTE — Progress Notes (Signed)
Subjective:    Patient ID: Stacy Carrillo, female    DOB: 03-Feb-1951, 63 y.o.   MRN: [id number]  HPI 63 year old female presenting with bilateral hip pain x 6 months.   Patient states she has bilateral hip pain which is actually located along a band of iliac crest. States she has severe pain when she wakes up in the morning if she sleeps on her stomach or back. This pain has occasionally woke her from sleep and required repositioning.  If she sleeps on her side on her couch she does not have any pain. Pain has been gradually worsening since it started. Has some mild pain in the day time. Has made her stop doing side kicks in her exercises as well as some twisting motion at the hips. She has tried tylenol before bed which helps some. Has not tried ice. States the left side is worse than the right. If she pushes along this area, she does have pain.   On further questioning, discovered that patient enjoys recreational rowing and that she does notice the pain when rowing. She uses a rather short paddle in a heavy boat for lake kayaking  Medical history-hypothyroidism, migraines Surgical history-hysterectomy for benign reasons, appendectomy Family history - mother died of MVA at age 24 but had heart disease. Father had stomach cancer of stomach and liver.  Social history- never smoker, lives with husband Medications Current Outpatient Prescriptions on File Prior to Visit  Medication Sig Dispense Refill  . b complex vitamins tablet Take 1 tablet by mouth daily.      . Cholecalciferol (VITAMIN D) 2000 UNITS tablet Take 2,000 Units by mouth daily.        Marland Kitchen conjugated estrogens (PREMARIN) vaginal cream Place one applicatorful per vagina twice weekly  42.5 g  12  . desvenlafaxine (PRISTIQ) 50 MG 24 hr tablet Take 50 mg by mouth daily.        Marland Kitchen ezetimibe (ZETIA) 10 MG tablet Take 1 tablet (10 mg total) by mouth daily. <please make appointment for future refills>  90 tablet  0  . L-Methylfolate  (DEPLIN PO) Take by mouth.      . levothyroxine (SYNTHROID, LEVOTHROID) 25 MCG tablet TAKE 1 TABLET BY MOUTH EVERY DAY  30 tablet  5  . LORazepam (ATIVAN) 0.5 MG tablet Take 0.5 mg by mouth as needed.        . Magnesium 250 MG TABS Take 1 tablet by mouth daily.        . ranitidine (ZANTAC) 150 MG tablet Take 150 mg by mouth as needed.        . zolpidem (AMBIEN) 5 MG tablet Take 5 mg by mouth at bedtime as needed.        No current facility-administered medications on file prior to visit.  Allergies- azithromycin  Review of Systems Denies nausea/vomiting/fever/chills/fatigue/overall sick feelings. No stiffness at hips.     Objective:   Physical Exam Gen: NAD, resting comfortably in chair, when she gets onto the table she has some pain with repositioning  MSK/Neuro Bilateral Hip: ROM IR: 80 Deg, ER: 80 Deg, Flexion: 120 Deg, Extension: 100 Deg, Abduction: 45 Deg, Adduction: 45 Deg Strength IR: 5/5, ER: 5/5, Flexion: 5/5, Extension: 5/5, Abduction: 4+/5, Adduction: 5/5 Pelvic alignment unremarkable to inspection and palpation. Standing hip rotation and gait without trendelenburg / unsteadiness. Greater trochanter without tenderness to palpation. No tenderness over piriformis and greater trochanter. No SI joint tenderness and normal minimal SI movement. Patient does have  pain to palpation over iliac crest at insertion point of external iliacs L>R Several trunk movements also produce pain in area that require use of obliques Skin: warm and dry, no rash  Neuro: sensation and reflexes normal throughout lower extremity  MSK [place]: Hypoechoic area proximal to attachment of external oblique onto iliac crest noted bilaterally but more extensive on left compared to right    Assessment & Plan:   External Oblique Strain/Tendinopathy This injury is uncommon but likely related to her recreational rowing and weakness of core. msk [place] consistent with exam and history taking which point to external  oblique strain/tendinopathy.  Home exercise program for core strengthening and specifically external obliques provided.  Considered nitroglycerin patches but patient with history of migraines.  Patient has an upcoming out of the country trip so she may opt to return in 2-3 weeks for injection of the area if does not have improvement with above plan. Otherwise follow up 6-8 weeks.

## 2014-05-14 NOTE — Patient Instructions (Signed)
1. Do side planks as well as shoulder bridge. Try to do for 15 seconds x2  and build up time after that.  2. Do side bends with bar. Do bar cross over. Do 5-8 on each side. Do 2 sets if you can. Then build up sets.  3. Do crunches with slight incline, straight, then touching each elbow to knee with knees bent. Do 5-8 of each. Do 2 sets if you can. Then build up sets.   No exercises should cause more than mild pain. Stop if it does.  With your trip coming up, if you are not having at least some improvement in 2-3 weeks give [place] a call and we could consider injection.  Otherwise follow up in 6-8 weeks, Dr. Yong Channel

## 2014-05-15 DIAGNOSIS — M25552 Pain in left hip: Secondary | ICD-10-CM | POA: Insufficient documentation

## 2014-05-15 NOTE — Assessment & Plan Note (Signed)
Trial with HEP Consider CSI if not improving  Hx of migraine and we chose not to use NTG

## 2014-06-26 ENCOUNTER — Encounter: Payer: Self-pay | Admitting: Sports Medicine

## 2014-06-26 ENCOUNTER — Ambulatory Visit (INDEPENDENT_AMBULATORY_CARE_PROVIDER_SITE_OTHER): Payer: 59 | Admitting: Sports Medicine

## 2014-06-26 VITALS — Ht 67.0 in | Wt 150.0 lb

## 2014-06-26 DIAGNOSIS — M25552 Pain in left hip: Principal | ICD-10-CM

## 2014-06-26 DIAGNOSIS — M25559 Pain in unspecified hip: Secondary | ICD-10-CM

## 2014-06-26 DIAGNOSIS — M25551 Pain in right hip: Secondary | ICD-10-CM

## 2014-06-26 NOTE — Assessment & Plan Note (Signed)
Pain continues; some improvement initially with HEP but worsened after vacation and walking - Re ultrasound shows improving external oblique tear, but new swelling in gluteus medius; Pt with new ITB pain - refer to PT for evaluation and treatment of core stability, glut med and ITB - Discussed CSI - she will call if not improving with PT in 6-8 weeks; Consider CSI vs additional imaging if not improving

## 2014-06-26 NOTE — Progress Notes (Signed)
  Stacy Carrillo - 63 y.o. female MRN [id number]  Date of birth: Jun 05, 1951    SUBJECTIVE:     F/u of b/l hip pain. She reports some initial improvement with HEP but pain worsened after returning from vacation and doing a lot of walking. Pain mostly in left hip now. Pain also worse after [place] which she has stopped. Pain 6/10 when at its worse, which improves with tylenol. 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   Has done no kayaking since original injury  ROS:     Denies LE numbness, weakness, or tingling  PERTINENT  PMH / PSH FH / / SH:  Past Medical, Surgical, Social, and Family History Reviewed & Updated in the EMR.  Pertinent findings include:  - none  OBJECTIVE: Ht 5\' 7"  (1.702 m)  Wt 150 lb (68.04 kg)  BMI 23.49 kg/m2  Physical Exam:  Vital signs are reviewed.  Left hip - no swelling or erythema. Tenderness to palpation at posterior superior iliac crest. Additionally tenderness along left ITB. Full ROM. Hip abduction 5/5 but painful. Fabere produces pain at lateral hip.   Ultrasound - left external oblique MM and tendon had a lot of hypoechoic change but now appears improved from previous ultrasound. New left gluteus medius edema noted w/o tear. Tensor fascial lata intact.   ASSESSMENT & PLAN:  See problem based charting & AVS for pt instructions.

## 2014-06-30 ENCOUNTER — Other Ambulatory Visit: Payer: Self-pay | Admitting: Family Medicine

## 2014-06-30 NOTE — Addendum Note (Signed)
Addended by: Laurey Arrow on: 06/30/2014 05:06 PM   Modules accepted: Orders

## 2014-07-03 ENCOUNTER — Telehealth: Payer: Self-pay | Admitting: *Deleted

## 2014-07-03 NOTE — Telephone Encounter (Signed)
Referral done and faxed to [phone].

## 2014-07-03 NOTE — Telephone Encounter (Signed)
Patient calls and states that she saw Dr.Karl Fields @ [facility] and he has referred her to [place] [phone]) for PT for partially torn oblique. She got a new [employer] as of July 1st that is requiring her to have a referral from PCP for the PT. Do you need her to come in for a visit or are you okay with me calling and doing the referral? Was unsure, please advise. Thanks.

## 2014-07-03 NOTE — Telephone Encounter (Signed)
Ok to do referral?  

## 2014-07-21 ENCOUNTER — Encounter: Payer: Self-pay | Admitting: Family Medicine

## 2014-08-07 ENCOUNTER — Encounter: Payer: Self-pay | Admitting: Family Medicine

## 2014-08-07 ENCOUNTER — Ambulatory Visit (INDEPENDENT_AMBULATORY_CARE_PROVIDER_SITE_OTHER): Payer: No Typology Code available for payment source | Admitting: Family Medicine

## 2014-08-07 VITALS — BP 110/70 | HR 72 | Temp 98.7°F | Ht 66.25 in | Wt 162.0 lb

## 2014-08-07 DIAGNOSIS — H918X9 Other specified hearing loss, unspecified ear: Secondary | ICD-10-CM

## 2014-08-07 DIAGNOSIS — H6121 Impacted cerumen, right ear: Secondary | ICD-10-CM

## 2014-08-07 DIAGNOSIS — H6120 Impacted cerumen, unspecified ear: Secondary | ICD-10-CM

## 2014-08-07 DIAGNOSIS — J309 Allergic rhinitis, unspecified: Secondary | ICD-10-CM

## 2014-08-07 NOTE — Progress Notes (Signed)
Chief Complaint  Patient presents with  . Sinus Problem    facial pain and pressure. Ear pain and stuffiness. Gets a lot of HA's during the fall so she started using Flonase Sunday and symptoms began Monday. Has continued to use Flonase daily except for today. HA is mostly gone today. Will get flu shot next week at CPE here.    She is complaining of ears feeling stopped up, decreased hearing in right>left ear.  Symptoms started 3 days ago. Last night she had ear pain, not currently hurting.  Having some facial pain at both cheeks.  Nasal mucus is clear, not much, not particularly thick.  Some PND, no cough, sore throat, hoarseness.  Denies sick contacts.  She typically has fall allergies.  When her headaches started she started Flonase--this is the first time using Flonase or any nasal steroids.    Past Medical History  Diagnosis Date  . Vitamin D deficiency   . IBS (irritable bowel syndrome) constipation(dr elliot [place])  . RLS (restless legs syndrome)   . GERD (gastroesophageal reflux disease) HH per pt EGD nl 2/06  . Colon polyps   . Osteopenia hx  . Elevated cholesterol   . OSA (obstructive sleep apnea) moderate, not using CPAP, aware of risks    2007  . Depression panic(dr.william chen)  . Anxiety     GENERALIZED/WILLIAM CHEN MD  . Hypothyroid    Past Surgical History  Procedure Laterality Date  . Abdominal hysterectomy  1999    and BSO (for benign reasons)  . Colonoscopy  2011    Dr. Vira Agar [facility] [place])  . Appendectomy  1999  . Esophagogastroduodenoscopy  2009   History   Social History  . Marital Status: Married    Spouse Name: N/A    Number of Children: 1  . Years of Education: N/A   Occupational History  . homemaker    Social History Main Topics  . Smoking status: Never Smoker   . Smokeless tobacco: Never Used  . Alcohol Use: No     Comment: socially, 1 glass wine bi-weekly  . Drug Use: No  . Sexual Activity: Yes    Partners: Male    Other Topics Concern  . Not on file   Social History Narrative   Lives with husband, dog.  Son lives in [facility]   Outpatient Encounter Prescriptions as of 08/07/2014  Medication Sig Note  . b complex vitamins tablet Take 1 tablet by mouth daily.   . Cholecalciferol (VITAMIN D) 2000 UNITS tablet Take 2,000 Units by mouth daily.     Marland Kitchen conjugated estrogens (PREMARIN) vaginal cream Place one applicatorful per vagina twice weekly 08/07/2014: Uses sporadically  . desvenlafaxine (PRISTIQ) 50 MG 24 hr tablet Take 50 mg by mouth daily.     Marland Kitchen ezetimibe (ZETIA) 10 MG tablet Take 1 tablet (10 mg total) by mouth daily. <please make appointment for future refills>   . fluticasone (FLONASE) 50 MCG/ACT nasal spray Place 1 spray into both nostrils daily. 08/07/2014: Started 3-4 days ago, hasn't used today  . L-Methylfolate (DEPLIN PO) Take by mouth.   . levothyroxine (SYNTHROID, LEVOTHROID) 25 MCG tablet TAKE 1 TABLET BY MOUTH ONCE DAILY   . LORazepam (ATIVAN) 0.5 MG tablet Take 0.5 mg by mouth as needed.   11/14/2013: Uses once a week or less  . Magnesium 250 MG TABS Take 1 tablet by mouth daily.     Marland Kitchen zolpidem (AMBIEN) 5 MG tablet Take 5 mg by mouth at  bedtime as needed.    . [DISCONTINUED] vitamin B-12 (CYANOCOBALAMIN) 1000 MCG tablet Take 1,000 mcg by mouth. 06/26/2014: Received from: [place]  . ranitidine (ZANTAC) 150 MG tablet Take 150 mg by mouth as needed.      Allergies  Allergen Reactions  . Zithromax [Azithromycin] Hives   ROS:  No fevers, chills.  +intermittent sinus headache, none currently.  +decreased hearing, sinus pressure as per HPI.  No cough, shortness of breath, chest pain, nausea, vomiting, bowel changes, GU complaints, rashes, or other concerns.  Recently pulled oblique, hasn't been able exercise/kayak recently.  PHYSICAL EXAM: BP 110/70  Pulse 72  Temp(Src) 98.7 F (37.1 C) (Oral)  Ht 5' 6.25" (1.683 m)  Wt 162 lb (73.483 kg)  BMI 25.94 kg/m2 Well developed, pleasant  female in no distress HEENT: PERRL, EOMI, conjunctiva clear.  Nasal mucosa is moderately edematous, pale, clear mucus R TM is obscured by cerumen. L TM and EAC is normal. After ear lavage (no wax was removed, but it was able to be moved such that it was no longer occluding canal--didn't tolerate lavage due to discomfort). TM was easily visualized--normal, without effusion, erythema or bulging.  OP is clear. Mild tenderness over both frontal and maxillary sinuses. Neck: slight shotty anterior cervical lymphadenopathy Heart: regular rate and rhythm Lungs: clear bilaterally Skin: no rashes Psych: normal mood, affect  ASSESSMENT/PLAN:  Allergic rhinitis, cause unspecified - continue flonase--reviewed proper technique.  antihistamines prn; neti-pot; mucinex prn.  Cerumen impaction, right  Hearing loss due to cerumen impaction, right - resolved with moving cerumen with attempted lavage  Restart using Neti-pot. Continue to use Flonase. Consider mucinex if having thick mucus.  Reviewed proper use of flonase; reviewed risks/side effects.  F/u if recurrent hearing loss, worsening symptoms, purulent drainage develops

## 2014-08-07 NOTE — Patient Instructions (Signed)
  Restart using Neti-pot. Continue to use Flonase. Consider mucinex if having thick mucus.  Consider using claritin or allegra as needed for allergies, if not adequately controlled by flonase alone.

## 2014-08-14 ENCOUNTER — Ambulatory Visit (INDEPENDENT_AMBULATORY_CARE_PROVIDER_SITE_OTHER): Payer: 59 | Admitting: Family Medicine

## 2014-08-14 ENCOUNTER — Encounter: Payer: Self-pay | Admitting: Family Medicine

## 2014-08-14 VITALS — BP 118/76 | HR 68 | Ht 66.5 in | Wt 162.0 lb

## 2014-08-14 DIAGNOSIS — E7800 Pure hypercholesterolemia, unspecified: Secondary | ICD-10-CM

## 2014-08-14 DIAGNOSIS — R7301 Impaired fasting glucose: Secondary | ICD-10-CM

## 2014-08-14 DIAGNOSIS — N952 Postmenopausal atrophic vaginitis: Secondary | ICD-10-CM

## 2014-08-14 DIAGNOSIS — E039 Hypothyroidism, unspecified: Secondary | ICD-10-CM

## 2014-08-14 DIAGNOSIS — Z0000 Encounter for general adult medical examination without abnormal findings: Secondary | ICD-10-CM

## 2014-08-14 DIAGNOSIS — E538 Deficiency of other specified B group vitamins: Secondary | ICD-10-CM

## 2014-08-14 DIAGNOSIS — R195 Other fecal abnormalities: Secondary | ICD-10-CM

## 2014-08-14 DIAGNOSIS — G4733 Obstructive sleep apnea (adult) (pediatric): Secondary | ICD-10-CM | POA: Insufficient documentation

## 2014-08-14 DIAGNOSIS — Z23 Encounter for immunization: Secondary | ICD-10-CM

## 2014-08-14 MED ORDER — ESTROGENS, CONJUGATED 0.625 MG/GM VA CREA: TOPICAL_CREAM | VAGINAL | Status: DC

## 2014-08-14 NOTE — Progress Notes (Signed)
Chief Complaint  Patient presents with  . CPE    CPE, non fasting. pap.   Stacy Carrillo is a 63 y.o. female who presents for a complete physical.  She has the following concerns:  F/u congestion and hearing loss--seen 9/3. Hearing improved after lavage attempt (did not remove wax, but it was moved to the side; intolerant of procedure). Hearing loss is back to baseline.  Congestion is much better since using Flonase regularly.  Atrophic vaginitis:  Last year we discussed Osphena vs topical estrogens.  She admits to not using the premarin cream very often (only a couple of times at first).  Intercourse is still painful. She still has occasional hot flashes.  Hyperlipidemia:  Treated by Dr. Claiborne Billings.  She is on Zetia without side effects.  She had myalgias on statins in the past.  She is due for lipids to be checked, hasn't seen him in a while.  Hypothyroidism:  On generic thyroid medication.  Taking it on an empty stomach, separate from other medications and vitamins.  Denies any missed doses.  She has noticed a little more loss than usual (mentioned by hair dresser over the last 3 months).  She has regained some of the weight she lost (trip to [place] and eating differently).  No changes in bowels, nails, energy, moods.  Seeing psychiatrist--doing well, stable on medications.  Mod OSA (2007 per chart). She was on CPAP for about a month, didn't like it.  She doesn't feel refreshed in the mornings, but thinks more related to trouble sleeping.  Sometimes wakes up with night sweats.  Husband reports that she no longer snores. Her husband just started on CPAP last month.  Immunization History  Administered Date(s) Administered  . Influenza Split 11/26/2012  . Influenza,inj,Quad PF,36+ Mos 11/14/2013  . Tdap 07/04/2013  . Zoster 07/04/2013   Last Pap smear: s/p hysterectomy Last mammogram: 07/2013 Last colonoscopy: 2011 Dr. Vira Agar in [place]: over 7 years ago, ?slight  abnormal, through GYN in [place]  Dentist: twice yearly  Ophtho: yearly  Exercise: works out with trainer 3 days/week, biking, swimming, kayaking, exercising 5d/week. She partially tore her oblique, and has had limited exercise since June. She has been getting PT. (seeing Dr. Oneida Alar).  Past Medical History  Diagnosis Date  . Vitamin D deficiency   . IBS (irritable bowel syndrome) constipation(dr elliot [place])  . RLS (restless legs syndrome)   . GERD (gastroesophageal reflux disease) HH per pt EGD nl 2/06  . Colon polyps   . Osteopenia hx  . Elevated cholesterol   . OSA (obstructive sleep apnea) moderate, not using CPAP, aware of risks    2007  . Depression panic(dr.william chen)  . Anxiety     GENERALIZED/WILLIAM CHEN MD  . Hypothyroid     Past Surgical History  Procedure Laterality Date  . Abdominal hysterectomy  1999    and BSO (for benign reasons)  . Colonoscopy  2011    Dr. Vira Agar [facility] [place])  . Appendectomy  1999  . Esophagogastroduodenoscopy  2009    History   Social History  . Marital Status: Married    Spouse Name: N/A    Number of Children: 1  . Years of Education: N/A   Occupational History  . homemaker    Social History Main Topics  . Smoking status: Never Smoker   . Smokeless tobacco: Never Used  . Alcohol Use: Yes     Comment: socially, 1 glass wine bi-weekly  .  Drug Use: No  . Sexual Activity: Yes    Partners: Male   Other Topics Concern  . Not on file   Social History Narrative   Lives with husband.  Son lives in [place]    Family History  Problem Relation Age of Onset  . Heart disease Mother   . Cancer Father     stomach and liver  . Sarcoidosis Sister   . Heart disease Brother 4  . Heart disease Maternal Aunt   . Heart disease Paternal Aunt   . Heart disease Paternal Uncle   . Stroke Neg Hx   . Colon cancer Neg Hx   . Depression Brother   . Breast cancer Cousin     50's  . Diabetes Other      gestational   Outpatient Encounter Prescriptions as of 08/14/2014  Medication Sig Note  . b complex vitamins tablet Take 1 tablet by mouth daily.   . Cholecalciferol (VITAMIN D) 2000 UNITS tablet Take 2,000 Units by mouth daily.     Marland Kitchen conjugated estrogens (PREMARIN) vaginal cream Place one applicatorful per vagina twice weekly 08/07/2014: Uses sporadically  . desvenlafaxine (PRISTIQ) 50 MG 24 hr tablet Take 50 mg by mouth daily.     Marland Kitchen ezetimibe (ZETIA) 10 MG tablet Take 1 tablet (10 mg total) by mouth daily. <please make appointment for future refills>   . fluticasone (FLONASE) 50 MCG/ACT nasal spray Place 1 spray into both nostrils daily. 08/14/2014: Using daily since last visit  . L-Methylfolate (DEPLIN PO) Take by mouth.   . levothyroxine (SYNTHROID, LEVOTHROID) 25 MCG tablet TAKE 1 TABLET BY MOUTH ONCE DAILY   . LORazepam (ATIVAN) 0.5 MG tablet Take 0.5 mg by mouth as needed.   11/14/2013: Uses once a week or less  . Magnesium 250 MG TABS Take 1 tablet by mouth daily.     . ranitidine (ZANTAC) 150 MG tablet Take 150 mg by mouth as needed.     . zolpidem (AMBIEN) 5 MG tablet Take 5 mg by mouth at bedtime as needed.  08/14/2014: Prn, about once a week, more with travel   Allergies  Allergen Reactions  . Zithromax [Azithromycin] Hives   ROS: The patient denies anorexia, fever, headaches (rare migraines, last was 2 months ago, much better); denies vision changes, ear pain, sore throat, breast concerns, chest pain, palpitations, dizziness, syncope, dyspnea on exertion, cough, swelling, nausea, vomiting, diarrhea, constipation, abdominal pain, melena, hematochezia, hematuria, incontinence, vaginal bleeding, discharge, odor or itch, genital lesions, joint pains, numbness, tingling, weakness, tremor, suspicious skin lesions, depression, abnormal bleeding/bruising, or enlarged lymph nodes.  Some knee pain, intermittent. Better since weight loss, some recent flare since her back has been hurting. Some  hearing loss and chronic tinnitus.  Plans to see ENT for routine check. Vaginal dryness and painful intercourse  Nails peeling, more brittle intermittently (okay now). Uses zantac once a week, prn for heartbrn  PHYSICAL EXAM:  BP 118/76  Pulse 68  Ht 5' 6.5" (1.689 m)  Wt 162 lb (73.483 kg)  BMI 25.76 kg/m2   General Appearance:  Alert, cooperative, no distress, appears stated age   Head:  Normocephalic, without obvious abnormality, atraumatic   Eyes:  PERRL, conjunctiva/corneas clear, EOM's intact, fundi  benign   Ears:  Normal TM's and external ear canals; mod cerumen in R ear, located posteriorly, non-occluding, L is clear   Nose:  Nares normal, mucosa mildly edematous, no drainage or sinus tenderness   Throat:  Lips, mucosa, and tongue  normal; teeth and gums normal   Neck:  Supple, no lymphadenopathy; thyroid: no enlargement/tenderness/nodules; no carotid  bruit or JVD   Back:  Spine nontender, no curvature, ROM normal, no CVA tenderness. Tender at L SI joint and lower lumbar paraspinous muscles on the left  Lungs:  Clear to auscultation bilaterally without wheezes, rales or ronchi; respirations unlabored   Chest Wall:  No tenderness or deformity   Heart:  Regular rate and rhythm, S1 and S2 normal, no murmur, rub  or gallop   Breast Exam:  No nipple inversion, nipple discharge. No dominant masses or axillary lymphadenopathy.   Abdomen:  Soft, non-tender, nondistended, normoactive bowel sounds,  no masses, no hepatosplenomegaly   Genitalia:  normal external genitalia, with minimal atrophic changes. Bimanual exam is normal--no appreciable masses. Uterus and ovaries surgically absent.    Rectal exam--normal sphincter tone, light brown stool that is hemoccult trace +   Extremities:  No clubbing, cyanosis or edema   Pulses:  2+ and symmetric all extremities   Skin:  Skin color, texture, turgor normal, no rashes or lesions   Lymph nodes:  Cervical, supraclavicular, and axillary  nodes normal   Neurologic:  CNII-XII intact, normal strength, sensation and gait; reflexes 2+ and symmetric throughout          Psych: Normal mood, affect, hygiene and grooming.    ASSESSMENT/PLAN:  Routine general medical examination at a health care facility - Plan: CBC with Differential, Lipid panel, Comprehensive metabolic panel, TSH  Unspecified hypothyroidism - Plan: TSH  B12 deficiency - has been taking B complex, no longer on B12 - Plan: CBC with Differential, Vitamin B12  Postmenopausal atrophic vaginitis - noncompliant with premarin cream.  She elects to retry.  Will call for Osphena if she prefers that over cream  Impaired fasting glucose - resume exercise, watch diet more closely - Plan: Hemoglobin A1c, Comprehensive metabolic panel  Pure hypercholesterolemia - intolerant of statin. On zetia per Dr. Claiborne Billings.  Due for labs - Plan: Lipid panel, Comprehensive metabolic panel  Atrophic vaginitis - Plan: conjugated estrogens (PREMARIN) vaginal cream  Need for prophylactic vaccination and inoculation against influenza - Plan: Flu Vaccine QUAD 36+ mos IM  Heme positive stool - +hemoccult on exam in a pt with known hemorrhoids, and lack of any GI symptoms.  kit given to repeat in future.  check CBC  OSA (obstructive sleep apnea) - per sleep study 2007.  Recommend repeat with at-home study.  If remains abnormal, will try oral device from dentist (rather than CPAP, intolerant)  c-met, lipid, TSH, A1c  Atrophic vaginitis with painful intercourse.  Discussed risks/benefits/side effects of premarin vaginal cream vs Osphena.  She prefers to try the vaginal cream again.  Call in 1-2 months if not helpful for rx for Osphena.  In-home sleep study.  If continues to show OSA, then consider oral device from dentist for treatment (intolerant of CPAP)--currently wearing retainers s/p invisalign.  Written rx given for DEXA (to be done in [place]).  If she cannot locate prior facility to f/u  DEXA, then can be done here and establish new baseline locally. Schedule mammo (in [place])  Discussed monthly self breast exams and yearly mammograms; at least 30 minutes of aerobic activity at least 5 days/week; proper sunscreen use reviewed; healthy diet, including goals of calcium and vitamin D intake and alcohol recommendations (less than or equal to 1 drink/day) reviewed; regular seatbelt use; changing batteries in smoke detectors.  Immunization recommendations discussed--flu shot today.  Colonoscopy recommendations  reviewed, UTD.  Hemoccult given. Do when no hemorrhoidal flare, stools are back to baseline.  Thyroid precription--has enough until end of the month--await for lab results then refill x 1 year

## 2014-08-14 NOTE — Patient Instructions (Signed)
  HEALTH MAINTENANCE RECOMMENDATIONS:  It is recommended that you get at least 30 minutes of aerobic exercise at least 5 days/week (for weight loss, you may need as much as 60-90 minutes). This can be any activity that gets your heart rate up. This can be divided in 10-15 minute intervals if needed, but try and build up your endurance at least once a week.  Weight bearing exercise is also recommended twice weekly.  Eat a healthy diet with lots of vegetables, fruits and fiber.  "Colorful" foods have a lot of vitamins (ie green vegetables, tomatoes, red peppers, etc).  Limit sweet tea, regular sodas and alcoholic beverages, all of which has a lot of calories and sugar.  Up to 1 alcoholic drink daily may be beneficial for women (unless trying to lose weight, watch sugars).  Drink a lot of water.  Calcium recommendations are 1200-1500 mg daily (1500 mg for postmenopausal women or women without ovaries), and vitamin D 1000 IU daily.  This should be obtained from diet and/or supplements (vitamins), and calcium should not be taken all at once, but in divided doses.  Monthly self breast exams and yearly mammograms for women over the age of 89 is recommended.  Sunscreen of at least SPF 30 should be used on all sun-exposed parts of the skin when outside between the hours of 10 am and 4 pm (not just when at beach or pool, but even with exercise, golf, tennis, and yard work!)  Use a sunscreen that says "broad spectrum" so it covers both UVA and UVB rays, and make sure to reapply every 1-2 hours.  Remember to change the batteries in your smoke detectors when changing your clock times in the spring and fall.  Use your seat belt every time you are in a car, and please drive safely and not be distracted with cell phones and texting while driving.   Please schedule your mammogram [facility]). Ideally, bone density should be done on same machine as in past, for better comparison.  Use the written order to try and  schedule where you had it before.  If that is a problem, we can always do it at the [facility] here in [place]--just let us know.  We should repeat the sleep study.  We discussed home sleep study--we will try and arrange this.

## 2014-08-19 ENCOUNTER — Other Ambulatory Visit: Payer: 59

## 2014-08-19 ENCOUNTER — Telehealth: Payer: Self-pay | Admitting: *Deleted

## 2014-08-19 DIAGNOSIS — E538 Deficiency of other specified B group vitamins: Secondary | ICD-10-CM

## 2014-08-19 DIAGNOSIS — Z0000 Encounter for general adult medical examination without abnormal findings: Secondary | ICD-10-CM

## 2014-08-19 DIAGNOSIS — E039 Hypothyroidism, unspecified: Secondary | ICD-10-CM

## 2014-08-19 DIAGNOSIS — R7301 Impaired fasting glucose: Secondary | ICD-10-CM

## 2014-08-19 DIAGNOSIS — E7800 Pure hypercholesterolemia, unspecified: Secondary | ICD-10-CM

## 2014-08-19 LAB — CBC WITH DIFFERENTIAL/PLATELET
BASOS PCT: 1 % (ref 0–1)
Basophils Absolute: 0 10*3/uL (ref 0.0–0.1)
EOS ABS: 0.1 10*3/uL (ref 0.0–0.7)
Eosinophils Relative: 2 % (ref 0–5)
HCT: 39.6 % (ref 36.0–46.0)
Hemoglobin: 13.8 g/dL (ref 12.0–15.0)
LYMPHS ABS: 1.9 10*3/uL (ref 0.7–4.0)
Lymphocytes Relative: 47 % — ABNORMAL HIGH (ref 12–46)
MCH: 31.1 pg (ref 26.0–34.0)
MCHC: 34.8 g/dL (ref 30.0–36.0)
MCV: 89.2 fL (ref 78.0–100.0)
Monocytes Absolute: 0.4 10*3/uL (ref 0.1–1.0)
Monocytes Relative: 10 % (ref 3–12)
NEUTROS ABS: 1.6 10*3/uL — AB (ref 1.7–7.7)
NEUTROS PCT: 40 % — AB (ref 43–77)
PLATELETS: 227 10*3/uL (ref 150–400)
RBC: 4.44 MIL/uL (ref 3.87–5.11)
RDW: 13.8 % (ref 11.5–15.5)
WBC: 4.1 10*3/uL (ref 4.0–10.5)

## 2014-08-19 NOTE — Telephone Encounter (Signed)
Sent referral to [facility] for in home sleep study.

## 2014-08-19 NOTE — Telephone Encounter (Signed)
Message copied by Clinton Sawyer on Tue Aug 19, 2014  4:35 PM ------      Message from: KNAPP, EVE      Created: Thu Aug 14, 2014  7:56 PM       Please arrange for in-home sleep study (dx mod OSA per 2007, intolerant of CPAP).      Also, her name was changed in system--can you put her preferred name/nickname like you usually do? Gabriel Cirri called her back as Bonnita Nasuti). Thanks! ------

## 2014-08-20 LAB — COMPREHENSIVE METABOLIC PANEL
ALT: 18 U/L (ref 0–35)
AST: 23 U/L (ref 0–37)
Albumin: 4.1 g/dL (ref 3.5–5.2)
Alkaline Phosphatase: 52 U/L (ref 39–117)
BUN: 14 mg/dL (ref 6–23)
CHLORIDE: 106 meq/L (ref 96–112)
CO2: 28 mEq/L (ref 19–32)
CREATININE: 0.94 mg/dL (ref 0.50–1.10)
Calcium: 9.4 mg/dL (ref 8.4–10.5)
Glucose, Bld: 101 mg/dL — ABNORMAL HIGH (ref 70–99)
Potassium: 4.4 mEq/L (ref 3.5–5.3)
Sodium: 139 mEq/L (ref 135–145)
Total Bilirubin: 0.4 mg/dL (ref 0.2–1.2)
Total Protein: 6.9 g/dL (ref 6.0–8.3)

## 2014-08-20 LAB — TSH: TSH: 2.188 u[IU]/mL (ref 0.350–4.500)

## 2014-08-20 LAB — LIPID PANEL
CHOL/HDL RATIO: 2.6 ratio
CHOLESTEROL: 183 mg/dL (ref 0–200)
HDL: 70 mg/dL (ref >39)
LDL Cholesterol: 98 mg/dL (ref 0–99)
TRIGLYCERIDES: 75 mg/dL (ref <150)
VLDL: 15 mg/dL (ref 0–40)

## 2014-08-20 LAB — HEMOGLOBIN A1C
HEMOGLOBIN A1C: 5.7 % — AB (ref <5.7)
Mean Plasma Glucose: 117 mg/dL — ABNORMAL HIGH (ref <117)

## 2014-08-20 LAB — VITAMIN B12: Vitamin B-12: 623 pg/mL (ref 211–911)

## 2014-09-06 ENCOUNTER — Other Ambulatory Visit: Payer: Self-pay | Admitting: Family Medicine

## 2014-09-22 ENCOUNTER — Ambulatory Visit: Payer: Self-pay | Admitting: Family Medicine

## 2014-09-22 LAB — HM MAMMOGRAPHY: HM MAMMO: NORMAL

## 2014-09-25 ENCOUNTER — Encounter: Payer: Self-pay | Admitting: *Deleted

## 2014-09-29 ENCOUNTER — Telehealth: Payer: Self-pay | Admitting: Family Medicine

## 2014-09-29 NOTE — Telephone Encounter (Signed)
Left message for patient to return my call.

## 2014-10-06 ENCOUNTER — Encounter: Payer: Self-pay | Admitting: Family Medicine

## 2014-10-22 ENCOUNTER — Encounter: Payer: Self-pay | Admitting: Family Medicine

## 2014-10-22 ENCOUNTER — Ambulatory Visit (INDEPENDENT_AMBULATORY_CARE_PROVIDER_SITE_OTHER): Payer: 59 | Admitting: Family Medicine

## 2014-10-22 VITALS — BP 108/62 | HR 80 | Temp 99.0°F | Ht 66.5 in | Wt 163.0 lb

## 2014-10-22 DIAGNOSIS — J019 Acute sinusitis, unspecified: Secondary | ICD-10-CM

## 2014-10-22 DIAGNOSIS — J029 Acute pharyngitis, unspecified: Secondary | ICD-10-CM

## 2014-10-22 DIAGNOSIS — J069 Acute upper respiratory infection, unspecified: Secondary | ICD-10-CM

## 2014-10-22 LAB — POCT RAPID STREP A (OFFICE): RAPID STREP A SCREEN: NEGATIVE

## 2014-10-22 MED ORDER — AMOXICILLIN 500 MG PO TABS: 1000.0000 mg | ORAL_TABLET | Freq: Two times a day (BID) | ORAL | Status: DC

## 2014-10-22 NOTE — Progress Notes (Signed)
Chief Complaint  Patient presents with  . Nasal Congestion    started with a ST about two weeks ago-ST throat went away, but has come back today. Back of her neck hurts. Drainage is clear to greenish if she can even get anything out of her head.    2 weeks ago she had sore throat, trouble swallowing. It lasted for 2 days and resolved, but then developed sneezing, allergies, head congestion.  Over the weekend she started having pain in her neck when she bends over, and now also has teeth/cheek pain when leaning forward.  Over the weekend she noticed small amounts of discolored/green mucus.  Over the weekend she had some matting and gunkiness to her left eye, but it was better the next day.  She took extra strength tylenol, microwavable sinus compress (that helps). This morning she felt a lot better, but worse as the day progresses. She uses her Flonase daily, but has not used any other OTC medications.  She is supposed to fly to [place]. No sick contacts.  PMH, PSH, SH reviewed.  Outpatient Encounter Prescriptions as of 10/22/2014  Medication Sig Note  . b complex vitamins tablet Take 1 tablet by mouth daily.   . Cholecalciferol (VITAMIN D) 2000 UNITS tablet Take 2,000 Units by mouth daily.     Marland Kitchen conjugated estrogens (PREMARIN) vaginal cream Place one applicatorful per vagina twice weekly   . desvenlafaxine (PRISTIQ) 50 MG 24 hr tablet Take 50 mg by mouth daily.     Marland Kitchen ezetimibe (ZETIA) 10 MG tablet Take 1 tablet (10 mg total) by mouth daily. <please make appointment for future refills>   . fluticasone (FLONASE) 50 MCG/ACT nasal spray Place 1 spray into both nostrils daily. 08/14/2014: Using daily since last visit  . L-Methylfolate (DEPLIN PO) Take by mouth.   . levothyroxine (SYNTHROID, LEVOTHROID) 25 MCG tablet TAKE 1 TABLET BY MOUTH EVERY DAY   . LORazepam (ATIVAN) 0.5 MG tablet Take 0.5 mg by mouth as needed.   11/14/2013: Uses once a week or less  . Magnesium 250 MG TABS Take 1 tablet  by mouth daily.     Marland Kitchen zolpidem (AMBIEN) 5 MG tablet Take 5 mg by mouth at bedtime as needed.  08/14/2014: Prn, about once a week, more with travel  . amoxicillin (AMOXIL) 500 MG tablet Take 2 tablets (1,000 mg total) by mouth 2 (two) times daily.   . ranitidine (ZANTAC) 150 MG tablet Take 150 mg by mouth as needed.      (amox rx'd today, not prior to visit).  Allergies  Allergen Reactions  . Zithromax [Azithromycin] Hives    ROS:  Denies known fever, slight chills. Denies significant cough, no shortness of breath. Denies nausea, vomiting, diarrhea. No urinary symptoms, rashes, bleeding/bruising. No chest pain, palpitations, dizziness. Some ongoing soreness in certain positions of her left arm since getting her flu shot  PHYSICAL EXAM: BP 108/62 mmHg  Pulse 80  Temp(Src) 99 F (37.2 C) (Tympanic)  Ht 5' 6.5" (1.689 m)  Wt 163 lb (73.936 kg)  BMI 25.92 kg/m2  Well developed, pleasant female in no distress HEENT: PERRL, EOMI, conjunctiva clear.  Tm's and EAC's normal. Nasal mucosa is moderately edematous, no significant erythema.  Mucus is clear-white.  Sinuses are mildly diffusely tender x 4.  OP is clear without lesions, erythema or exudate Neck: area of discomfort is posteriorly near insertion on occiput. No mass. FROM.  She has some tender anterior cervical lymphadenopathy (more tender on right than  left, same size bilaterally) Heart: regular rate and rhythm without murmur Lungs: clear bilaterally Skin: no rashes Psych: normal mood, affect, hygiene and grooming Neuro: alert and oriented. Cranial nerves intact, normal gait  ASSESSMENT/PLAN:  Acute upper respiratory infection - suspect viral etiology.  supportive measures reviewed  Sore throat - rapid strep negative; likely viral and/or related to PND - Plan: Rapid Strep A  Acute sinusitis, recurrence not specified, unspecified location - exam does NOT suggest bacterial infection today.  Rx given to use in case of  persistent/worsening symptoms - Plan: amoxicillin (AMOXIL) 500 MG tablet   Your history and exam suggest acute viral illness.  Your sinuses are hurting more from being full/congested rather than from a bacterial infection.  Keeping the sinuses clear will help you feel better.  Drink plenty of fluids. Try sinus rinses, Mucinex (1200 mg of Guaifenesin twice daily--buy the 12 hour version). Use Afrin prior to plane flight to help prevent ear pain (ordinally I would suggest use of sudafed or other decongestant prior to plane flight, and as needed for sinus pain). Continue the flonase.  Start the antibiotic if symptoms progressively get worse--fevers, discolored mucus, worsening sinus pain.  I would expect symptoms to improve over the next 3-5 days.

## 2014-10-22 NOTE — Patient Instructions (Signed)
  Your history and exam suggest acute viral illness.  Your sinuses are hurting more from being full/congested rather than from a bacterial infection.  Keeping the sinuses clear will help you feel better.  Drink plenty of fluids. Try sinus rinses, Mucinex (1200 mg of Guaifenesin twice daily--buy the 12 hour version). Use Afrin prior to plane flight to help prevent ear pain (ordinally I would suggest use of sudafed or other decongestant prior to plane flight, and as needed for sinus pain). Continue the flonase.  Start the antibiotic if symptoms progressively get worse--fevers, discolored mucus, worsening sinus pain.  I would expect symptoms to improve over the next 3-5 days.

## 2014-11-05 ENCOUNTER — Encounter: Payer: Self-pay | Admitting: Cardiovascular Disease

## 2014-11-05 ENCOUNTER — Ambulatory Visit (INDEPENDENT_AMBULATORY_CARE_PROVIDER_SITE_OTHER): Payer: No Typology Code available for payment source | Admitting: Cardiovascular Disease

## 2014-11-05 VITALS — BP 110/80 | HR 75 | Ht 67.0 in | Wt 167.6 lb

## 2014-11-05 DIAGNOSIS — F411 Generalized anxiety disorder: Secondary | ICD-10-CM

## 2014-11-05 DIAGNOSIS — E039 Hypothyroidism, unspecified: Secondary | ICD-10-CM

## 2014-11-05 DIAGNOSIS — E785 Hyperlipidemia, unspecified: Secondary | ICD-10-CM

## 2014-11-05 NOTE — Patient Instructions (Signed)
Your physician wants you to follow-up in: 1 year or sooner if needed. You will receive a reminder letter in the mail two months in advance. If you don't receive a letter, please call our office to schedule the follow-up appointment.  

## 2014-11-05 NOTE — Progress Notes (Signed)
Patient ID: JAUNA Carrillo, female   DOB: [birth year], 63 y.o.   MRN: [id number]     HPI: Stacy Carrillo is a 63 y.o. female who presents for fan 18 month ollowup Cardiologic evaluation.   [name] has a strong family history for coronary artery disease as well as hyperlipidemia. In June 2013 her total cholesterol was 273 with an LDL of 186 HDL 16 and triglycerides 94. An NMR profile showed an LDL particle #1940. She has a history of hypothyroidism for which she's been on Synthroid replacement as well as migraine headaches. When I saw her in November 2013 and obtained a [facility] lab assessment with genetic profile I recommended at least a trial of Zetia in light of her marked hyperlipidemia.  She was against statin initiation and did not want to initiate therapy Zetia and sought nutritional guidance.  She initially increased increased her exercise and had approximately a 30 pound weight loss over the past year.  A repeat NMR profile was done on 05/09/2012 her total cholesterol was still elevated at 222, HDL was excellent at 71 LDL was 137. Her LDL small particles were normal at 433 but she still had increased LDL particle number of 1578 nmol per liter.  As result, I recommend initiation of Zetia 10 mg, which she conceded to initiate.  Repeat laboratory was done in September 2015 which showed a total cholesterol 183, triglycerides 75, HDL 70, VLDL 15, and LDL cholesterol at 98.  A single blood sugar was 101.  She normal renal function.  She normal electrolytes.  Thyroid function was normal.  Over the past several months, she has had difficulty with musculoskeletal issues.  She has had hip pain and is diagnosed with an external oblique tear as well as gluteal tear.  She has not been able to exercise as regularly.  She presents for evaluation.  She denies any chest pain.  She denies palpitations.  She denies shortness of breath.    Past Medical History  Diagnosis Date  . Vitamin D  deficiency   . IBS (irritable bowel syndrome) constipation(dr elliot Westby)  . RLS (restless legs syndrome)   . GERD (gastroesophageal reflux disease) HH per pt EGD nl 2/06  . Colon polyps   . Osteopenia hx  . Elevated cholesterol   . OSA (obstructive sleep apnea) moderate, not using CPAP, aware of risks    2007  . Depression panic(dr.william chen)  . Anxiety     GENERALIZED/WILLIAM CHEN MD  . Hypothyroid     Past Surgical History  Procedure Laterality Date  . Abdominal hysterectomy  1999    and BSO (for benign reasons)  . Colonoscopy  2011    Dr. Vira Agar [facility] [place])  . Appendectomy  1999  . Esophagogastroduodenoscopy  2009    Allergies  Allergen Reactions  . Zithromax [Azithromycin] Hives    Current Outpatient Prescriptions  Medication Sig Dispense Refill  . b complex vitamins tablet Take 1 tablet by mouth daily.    . Cholecalciferol (VITAMIN D) 2000 UNITS tablet Take 2,000 Units by mouth daily.      Marland Kitchen conjugated estrogens (PREMARIN) vaginal cream Place one applicatorful per vagina twice weekly 42.5 g 1  . desvenlafaxine (PRISTIQ) 50 MG 24 hr tablet Take 50 mg by mouth daily.      Marland Kitchen ezetimibe (ZETIA) 10 MG tablet Take 1 tablet (10 mg total) by mouth daily. <please make appointment for future refills> 90 tablet 0  . fluticasone (FLONASE) 50 MCG/ACT  nasal spray Place 1 spray into both nostrils daily.    Marland Kitchen L-Methylfolate (DEPLIN PO) Take by mouth.    . levothyroxine (SYNTHROID, LEVOTHROID) 25 MCG tablet TAKE 1 TABLET BY MOUTH EVERY DAY 30 tablet 11  . LORazepam (ATIVAN) 0.5 MG tablet Take 0.5 mg by mouth as needed.      . Magnesium 250 MG TABS Take 1 tablet by mouth daily.      . ranitidine (ZANTAC) 150 MG tablet Take 150 mg by mouth as needed.      . zolpidem (AMBIEN) 5 MG tablet Take 5 mg by mouth at bedtime as needed.      No current facility-administered medications for this visit.    Socially she is married  To [employer]. Days per week  with a trainer 45 minutes at a time. Is no tobacco or alcohol use.   ROS General: Negative; No fevers, chills, or night sweats; positive for weight gain HEENT: Negative; No changes in vision or hearing, sinus congestion, difficulty swallowing Pulmonary: Negative; No cough, wheezing, shortness of breath, hemoptysis Cardiovascular: Negative; No chest pain, presyncope, syncope, palpitations GI: Negative; No nausea, vomiting, diarrhea, or abdominal pain GU: Negative; No dysuria, hematuria, or difficulty voiding Musculoskeletal: Positive for recent external oblique tear as well as gluteus tear Hematologic/Oncology: Negative; no easy bruising, bleeding Endocrine: She had been started on low-dose levothyroxine 25 g.  No diabetes Neuro: Negative; no changes in balance, headaches Skin: Negative; No rashes or skin lesions Psychiatric: Positive for mild anxiety Sleep: Negative; No snoring, daytime sleepiness, hypersomnolence, bruxism, restless legs, hypnogognic hallucinations, no cataplexy Other comprehensive 14 point system review is negative.   PE BP 110/80 mmHg  Pulse 75  Ht 5\' 7"  (1.702 m)  Wt 167 lb 9.6 oz (76.023 kg)  BMI 26.24 kg/m2  She refused to look at her weight.  Comparison, but compared to her last office visit in June 2014.  There is a 14 pound weight gain. General: Alert, oriented, no distress.  Skin: normal turgor, no rashes HEENT: Normocephalic, atraumatic. Pupils round and reactive; sclera anicteric;no lid lag,  Nose without nasal septal hypertrophy Mouth/Parynx benign; Mallinpatti scale 2 Neck: No JVD, no carotid bruits with normal carotid upstroke Lungs: clear to ausculatation and percussion; no wheezing or rales Heart: RRR, s1 s2 normal; 1/6 systolic murmur; no diastolic murmur. Abdomen: soft, nontender; no hepatosplenomehaly, BS+; abdominal aorta nontender and not dilated by palpation. Pulses 2+ Extremities: no clubbing cyanosis or edema, Homan's sign negative    Neurologic: grossly nonfocal Psychological: Normal affect and mood  ECG (and apparently read by me):  Normal sinus rhythm at 75 bpm.  No ectopy.  Normal intervals.  Prior June 2014 ECG: Normal sinus rhythm at 65 beats per minute   LABS:  BMET    Component Value Date/Time   NA 139 08/19/2014 1127   K 4.4 08/19/2014 1127   CL 106 08/19/2014 1127   CO2 28 08/19/2014 1127   GLUCOSE 101* 08/19/2014 1127   BUN 14 08/19/2014 1127   CREATININE 0.94 08/19/2014 1127   CALCIUM 9.4 08/19/2014 1127     Hepatic Function Panel     Component Value Date/Time   PROT 6.9 08/19/2014 1127   ALBUMIN 4.1 08/19/2014 1127   AST 23 08/19/2014 1127   ALT 18 08/19/2014 1127   ALKPHOS 52 08/19/2014 1127   BILITOT 0.4 08/19/2014 1127     CBC    Component Value Date/Time   WBC 4.1 08/19/2014 1127   RBC 4.44 08/19/2014  1127   HGB 13.8 08/19/2014 1127   HCT 39.6 08/19/2014 1127   PLT 227 08/19/2014 1127   MCV 89.2 08/19/2014 1127   MCH 31.1 08/19/2014 1127   MCHC 34.8 08/19/2014 1127   RDW 13.8 08/19/2014 1127   LYMPHSABS 1.9 08/19/2014 1127   MONOABS 0.4 08/19/2014 1127   EOSABS 0.1 08/19/2014 1127   BASOSABS 0.0 08/19/2014 1127     BNP No results found for: PROBNP  Lipid Panel     Component Value Date/Time   CHOL 183 08/19/2014 1127   TRIG 75 08/19/2014 1127   TRIG 70 05/09/2013 1117   HDL 70 08/19/2014 1127   CHOLHDL 2.6 08/19/2014 1127   VLDL 15 08/19/2014 1127   LDLCALC 98 08/19/2014 1127   LDLCALC 137* 05/09/2013 1117     RADIOLOGY: No results found.    ASSESSMENT AND PLAN: [name] has a history of hyperlipidemia and has been unsteady at 10 mg.  Initially, she was very hesitant to initiate any therapy for hyperlipidemia but after nutritional guidance despite weight loss she ultimately consented to initiate therapy.  She has tolerated Zetia without side effects.  Her most recent lipid profile was reviewed with her in detail and does show significant  improvement.  She has gained 14 pounds since her last office visit, which undoubtedly may be contributed by her orthopedic issues reducing her ability to exercise as aggressively.  She is on low-dose levothyroxine 25 g.  She's not having chest pain.  She's not having edema.  She denies myalgias.  Her blood pressure is stable.  She takes Pristiq 50 mg for anxiety and is tolerating this well.  We discussed weight loss and increased exercise.  She will continue Zetia as prescribed.  I will see her in one year for reevaluation.   Troy Sine, MD, [facility]  11/05/2014 12:29 PM

## 2014-11-06 DIAGNOSIS — E785 Hyperlipidemia, unspecified: Secondary | ICD-10-CM | POA: Insufficient documentation

## 2014-12-30 ENCOUNTER — Telehealth: Payer: Self-pay | Admitting: *Deleted

## 2014-12-30 MED ORDER — EZETIMIBE 10 MG PO TABS: 10.0000 mg | ORAL_TABLET | Freq: Every day | ORAL | Status: DC

## 2014-12-30 NOTE — Telephone Encounter (Signed)
PA completed via phone call to [employer]

## 2015-01-05 ENCOUNTER — Other Ambulatory Visit: Payer: Self-pay | Admitting: Family Medicine

## 2015-01-08 ENCOUNTER — Encounter: Payer: Self-pay | Admitting: Family Medicine

## 2015-01-08 ENCOUNTER — Ambulatory Visit (INDEPENDENT_AMBULATORY_CARE_PROVIDER_SITE_OTHER): Payer: No Typology Code available for payment source | Admitting: Family Medicine

## 2015-01-08 VITALS — BP 100/72 | HR 76 | Ht 66.5 in | Wt 166.0 lb

## 2015-01-08 DIAGNOSIS — R002 Palpitations: Secondary | ICD-10-CM

## 2015-01-08 DIAGNOSIS — E039 Hypothyroidism, unspecified: Secondary | ICD-10-CM | POA: Diagnosis not present

## 2015-01-08 DIAGNOSIS — F411 Generalized anxiety disorder: Secondary | ICD-10-CM

## 2015-01-08 LAB — BASIC METABOLIC PANEL
BUN: 20 mg/dL (ref 6–23)
CALCIUM: 9.4 mg/dL (ref 8.4–10.5)
CHLORIDE: 104 meq/L (ref 96–112)
CO2: 27 mEq/L (ref 19–32)
Creat: 0.94 mg/dL (ref 0.50–1.10)
GLUCOSE: 94 mg/dL (ref 70–99)
POTASSIUM: 4.1 meq/L (ref 3.5–5.3)
Sodium: 139 mEq/L (ref 135–145)

## 2015-01-08 NOTE — Progress Notes (Signed)
Chief Complaint  Patient presents with  . Tachycardia    feels like her has been racing, feels like something is "flip flipping" in her chest. Symptoms x 2 weeks. Feels like she is on a massive dose of caffeine-but husband feels like she is quite the opposite:lethargic, dragging and has no energy. Saw Dr.Chen (psych) this am and he wants her to see PCP to make sure she is medically okay. Saw Dr.Kelly in December and she told him about these symptoms as she was having them in Dec as well intermittently-he did EKG and it was normal.    Patient presents for evaluation of her heart.  She describes her symptoms as like heart is beating out of her chest/racing.  Sometimes has weird sensation of "flipping"--missed beats.  She notices that her pulse is fast, feels like the racing is higher in her chest (above the breast bone).  She has been having more hot flashes than normal, rare chilled feeling. Symptoms have been going on for the last 2 weeks.  She noticed symptoms more upon her arrival to the office, but then improved.  She had a migraine a couple of weeks ago, when this started. Didn't take anything for this (no caffeine or OTC meds).  Around the same time, she changed pharmacies. She is wondering if she could have gotten a "bad batch" of medicine (Pristiq); she takes no generics, is on all branded medications.  Dr. Bridgett Larsson gave Pristiq samples to try, to see if symptoms improve.  He also instructed her to take lorazepam q6hrs to see if it helps.  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  When she saw Dr. Claiborne Billings in December, she just had a sporadic palpitation, like a skipped beat, only about once a month--symptoms are much more significant now than when she saw him, occuring multiple times throughout the day.  She states that he thought it could have been more like a muscle spasm, didn't see abnormalities on EKG  No caffeine or any other changes in diet. Caffeine-free tea. Some  chocolate intake, but unchanged. No decongestants or OTC medications  Hypothyroidism:  Denies changes in weight, hair/skin/bowels.  PMH, PSH, SH reviewed. Outpatient Encounter Prescriptions as of 01/08/2015  Medication Sig Note  . b complex vitamins tablet Take 1 tablet by mouth daily.   . Cholecalciferol (VITAMIN D) 2000 UNITS tablet Take 2,000 Units by mouth daily.     Marland Kitchen desvenlafaxine (PRISTIQ) 50 MG 24 hr tablet Take 50 mg by mouth daily.     Marland Kitchen ezetimibe (ZETIA) 10 MG tablet Take 1 tablet (10 mg total) by mouth daily.   Marland Kitchen L-Methylfolate (DEPLIN PO) Take by mouth.   . levothyroxine (SYNTHROID, LEVOTHROID) 25 MCG tablet TAKE 1 TABLET BY MOUTH EVERY DAY   . LORazepam (ATIVAN) 0.5 MG tablet Take 0.5 mg by mouth as needed.   11/14/2013: Uses once a week or less  . Magnesium 250 MG TABS Take 1 tablet by mouth daily.     . ranitidine (ZANTAC) 150 MG tablet Take 150 mg by mouth as needed.     . zolpidem (AMBIEN) 5 MG tablet Take 5 mg by mouth at bedtime as needed.  08/14/2014: Prn, about once a week, more with travel  . conjugated estrogens (PREMARIN) vaginal cream Place one applicatorful per vagina twice weekly (Patient not taking: Reported on 01/08/2015)   . fluticasone (FLONASE) 50 MCG/ACT nasal spray Place 1 spray into both nostrils daily. 08/14/2014: Using daily since last visit  Allergies  Allergen Reactions  . Zithromax [Azithromycin] Hives   ROS:  No fevers, chills, URI symptoms, headaches, dizziness, chest pain. No nausea, vomiting, bowel changes, heartburn, dysphagia.  She has a h/o reflux in the past, but no problems recently. Denies leg cramps, other muscle spasms. She drinks plenty of water. No bleeding, bruising, rash, or other concerns except at noted in HPI.  PHYSICAL EXAM: BP 100/72 mmHg  Pulse 76  Ht 5' 6.5" (1.689 m)  Wt 166 lb (75.297 kg)  BMI 26.39 kg/m2  Well developed, well-appearing female in no distress HEENT: PERRL, EOMI, conjunctiva clear.  OP clear Neck: no  lymphadenopathy, thyromegaly or mass Heart: regular rate and rhythm. No murmur, rub, gallop. No ectopy. She did experience some very mild symptoms during exam; heart exam remained normal Lungs: clear bilaterally Abdomen: soft, nontender Extremities: no edema Psych: normal mood, slightly anxious.  Normal speech, eye contact, hygiene and grooming Neuro: alert and oriented. Cranial nerves intact, normal strength, gait   Lab Results  Component Value Date   TSH 2.188 08/19/2014   ASSESSMENT/PLAN:  Palpitations - Plan: Basic metabolic panel, TSH  Hypothyroidism, unspecified hypothyroidism type - Plan: TSH  Anxiety state  Counseled re: differential diagnosis in detail.  Discussed anxiety, thyroid, palpitations (PAC's, PVC's vs other arrhythmia), vs possibility of reflux causing fluttering symptoms.  Instructed how to check her pulse, to see if the symptoms she feels is related to heart or not.  If so, and if tests are normal and symptoms persist despite treating anxiety, she should f/u with her cardiologist. Discussed trial of presumptive treatment for reflux as possible cause (OTC meds).  25 min visit, more than 1/2 spent counseling

## 2015-01-08 NOTE — Patient Instructions (Signed)
Continue to drink plenty of fluids. Pay attention to your heartbeat when you feel this sensation--feel your pulse in your wrist or neck) to feel if this irregular sensation is also noted in your pulse, or if the pulse is regular.  If your pulse is regular, and not having the same fluttery sensation you feel, then it probably is NOT your heart.  If the pulse is irregular (skipping beat, extra beats, or fast), then you should follow up with Dr. Claiborne Billings to discuss (and consider wearing a monitor to capture the irregular rhythm if it can't be captured in the office).  Sometimes this fluttery sensation in the upper chest/throat area can be related to reflux.  Pay attention if symptoms are related to meals, or foods that are likely to trigger reflux (ie spicy foods, citrus, etc).  You can also try zantac or prilosec to see if it helps.  We will be in touch through MyChart with your thyroid and electrolyte results--I suspect they will be normal.

## 2015-01-09 LAB — TSH: TSH: 2.514 u[IU]/mL (ref 0.350–4.500)

## 2015-02-24 ENCOUNTER — Telehealth: Payer: Self-pay | Admitting: Cardiovascular Disease

## 2015-02-24 MED ORDER — EZETIMIBE 10 MG PO TABS: 10.0000 mg | ORAL_TABLET | Freq: Every day | ORAL | Status: DC

## 2015-02-24 NOTE — Telephone Encounter (Signed)
Pt wants to know if there is a drug discount card for Zetia?

## 2015-02-24 NOTE — Telephone Encounter (Signed)
Drug card & samples left for pt at front desk, called pt to inform.

## 2015-05-12 ENCOUNTER — Other Ambulatory Visit: Payer: Self-pay | Admitting: Cardiovascular Disease

## 2015-05-12 NOTE — Telephone Encounter (Signed)
Rx(s) sent to pharmacy electronically.  

## 2015-06-11 ENCOUNTER — Telehealth: Payer: Self-pay | Admitting: Family Medicine

## 2015-06-11 NOTE — Telephone Encounter (Signed)
I don't have any records indicating that she has seen Dr. Wynelle Link in the past.  I see where she has seen Dr. Oneida Alar.  If she is asking for a new referral, generally an OV is recommended (if ongoing care, where she has seen them before for the same problem, I have no problem with the referral). Please clarify and advise pt.

## 2015-06-11 NOTE — Telephone Encounter (Signed)
Pt called and needs referralto Dr. Ricki Rodriguez at [place] for appt 07/01/15.  Pt ph [phone]

## 2015-06-12 NOTE — Telephone Encounter (Signed)
Pt said she saw Dr. Wynelle Link a couple years ago. I have Done referral for pt . Referral Number is [id number]

## 2015-07-17 ENCOUNTER — Telehealth: Payer: Self-pay | Admitting: Family Medicine

## 2015-07-17 NOTE — Telephone Encounter (Signed)
Please contact either Anderson Malta at [place]   Or referral coordinator, Anderson Malta at  [phone] ext [phone]       Re: message below

## 2015-07-17 NOTE — Telephone Encounter (Signed)
Anderson Malta with [facility] called, needs [facility] referral for patient. She is seeing Dr. Tonita Cong on Monday for her back Anderson Malta states that we previously referred patient to Dr. Maureen Ralphs for L hip pain and now Dr. Maureen Ralphs is having her see Dr. Tonita Cong because he believes hip pain may be related to back issues

## 2015-07-17 NOTE — Telephone Encounter (Signed)
Done

## 2015-07-26 ENCOUNTER — Other Ambulatory Visit: Payer: Self-pay | Admitting: Family Medicine

## 2015-07-27 ENCOUNTER — Telehealth: Payer: Self-pay | Admitting: *Deleted

## 2015-07-27 DIAGNOSIS — E7800 Pure hypercholesterolemia, unspecified: Secondary | ICD-10-CM

## 2015-07-27 DIAGNOSIS — E039 Hypothyroidism, unspecified: Secondary | ICD-10-CM

## 2015-07-27 DIAGNOSIS — R7301 Impaired fasting glucose: Secondary | ICD-10-CM

## 2015-07-27 DIAGNOSIS — E538 Deficiency of other specified B group vitamins: Secondary | ICD-10-CM

## 2015-07-27 DIAGNOSIS — Z5181 Encounter for therapeutic drug level monitoring: Secondary | ICD-10-CM

## 2015-07-27 NOTE — Telephone Encounter (Signed)
Patient scheduled CPE for 08-25-15, in the afternoon. Would like to come in Fri before labs. Please know me know and I can order them for you, thanks.

## 2015-07-27 NOTE — Addendum Note (Signed)
Addended by: Rita Ohara on: 07/27/2015 04:33 PM   Modules accepted: Orders

## 2015-07-27 NOTE — Telephone Encounter (Signed)
Orders entered

## 2015-07-31 ENCOUNTER — Telehealth: Payer: Self-pay

## 2015-07-31 NOTE — Telephone Encounter (Addendum)
Received request from pharmacy wanting to change to 90 day instead of 30 days for the Levothyroxine 61mcg

## 2015-07-31 NOTE — Telephone Encounter (Signed)
Ok to change to 90.

## 2015-08-03 MED ORDER — LEVOTHYROXINE SODIUM 25 MCG PO TABS: 25.0000 ug | ORAL_TABLET | Freq: Every day | ORAL | Status: DC

## 2015-08-03 NOTE — Telephone Encounter (Signed)
Changed and sent to pharmacy

## 2015-08-11 ENCOUNTER — Telehealth: Payer: Self-pay

## 2015-08-11 NOTE — Telephone Encounter (Signed)
Records sent to [facility] on 08/11/2015

## 2015-08-12 ENCOUNTER — Telehealth: Payer: Self-pay | Admitting: *Deleted

## 2015-08-12 NOTE — Telephone Encounter (Signed)
Yes, ok 

## 2015-08-12 NOTE — Telephone Encounter (Signed)
Patient called and is scheduled to see her chiropractor tomorrow for LBP and her dermatologist next week for annual skin check. Needs [employer] referrals done and faxed-are these okay for me to go ahead and do?

## 2015-08-21 ENCOUNTER — Other Ambulatory Visit: Payer: No Typology Code available for payment source

## 2015-08-21 DIAGNOSIS — Z5181 Encounter for therapeutic drug level monitoring: Secondary | ICD-10-CM

## 2015-08-21 DIAGNOSIS — R7301 Impaired fasting glucose: Secondary | ICD-10-CM

## 2015-08-21 DIAGNOSIS — E538 Deficiency of other specified B group vitamins: Secondary | ICD-10-CM

## 2015-08-21 DIAGNOSIS — E7800 Pure hypercholesterolemia, unspecified: Secondary | ICD-10-CM

## 2015-08-21 DIAGNOSIS — E039 Hypothyroidism, unspecified: Secondary | ICD-10-CM

## 2015-08-21 LAB — CBC WITH DIFFERENTIAL/PLATELET
BASOS ABS: 0 10*3/uL (ref 0.0–0.1)
BASOS PCT: 1 % (ref 0–1)
EOS ABS: 0.1 10*3/uL (ref 0.0–0.7)
EOS PCT: 2 % (ref 0–5)
HCT: 42.1 % (ref 36.0–46.0)
Hemoglobin: 14 g/dL (ref 12.0–15.0)
LYMPHS ABS: 1.5 10*3/uL (ref 0.7–4.0)
LYMPHS PCT: 39 % (ref 12–46)
MCH: 31 pg (ref 26.0–34.0)
MCHC: 33.3 g/dL (ref 30.0–36.0)
MCV: 93.3 fL (ref 78.0–100.0)
MONO ABS: 0.4 10*3/uL (ref 0.1–1.0)
MONOS PCT: 10 % (ref 3–12)
MPV: 9.2 fL (ref 8.6–12.4)
NEUTROS ABS: 1.8 10*3/uL (ref 1.7–7.7)
Neutrophils Relative %: 48 % (ref 43–77)
PLATELETS: 262 10*3/uL (ref 150–400)
RBC: 4.51 MIL/uL (ref 3.87–5.11)
RDW: 13.5 % (ref 11.5–15.5)
WBC: 3.8 10*3/uL — AB (ref 4.0–10.5)

## 2015-08-21 LAB — LIPID PANEL
CHOL/HDL RATIO: 2.9 ratio (ref <=5.0)
CHOLESTEROL: 199 mg/dL (ref 125–200)
HDL: 68 mg/dL (ref >=46)
LDL Cholesterol: 113 mg/dL (ref <130)
Triglycerides: 91 mg/dL (ref <150)
VLDL: 18 mg/dL (ref <30)

## 2015-08-21 LAB — COMPREHENSIVE METABOLIC PANEL
ALBUMIN: 4.1 g/dL (ref 3.6–5.1)
ALK PHOS: 68 U/L (ref 33–130)
ALT: 18 U/L (ref 6–29)
AST: 23 U/L (ref 10–35)
BUN: 11 mg/dL (ref 7–25)
CHLORIDE: 106 mmol/L (ref 98–110)
CO2: 26 mmol/L (ref 20–31)
CREATININE: 0.99 mg/dL (ref 0.50–0.99)
Calcium: 9 mg/dL (ref 8.6–10.4)
Glucose, Bld: 94 mg/dL (ref 65–99)
Potassium: 4.5 mmol/L (ref 3.5–5.3)
SODIUM: 141 mmol/L (ref 135–146)
TOTAL PROTEIN: 6.5 g/dL (ref 6.1–8.1)
Total Bilirubin: 0.6 mg/dL (ref 0.2–1.2)

## 2015-08-22 LAB — HEMOGLOBIN A1C
HEMOGLOBIN A1C: 5.8 % — AB (ref <5.7)
Mean Plasma Glucose: 120 mg/dL — ABNORMAL HIGH (ref <117)

## 2015-08-22 LAB — TSH: TSH: 3.079 u[IU]/mL (ref 0.350–4.500)

## 2015-08-22 LAB — VITAMIN B12: VITAMIN B 12: 780 pg/mL (ref 211–911)

## 2015-08-25 ENCOUNTER — Telehealth: Payer: Self-pay | Admitting: Family Medicine

## 2015-08-25 ENCOUNTER — Encounter: Payer: Self-pay | Admitting: Family Medicine

## 2015-08-25 ENCOUNTER — Ambulatory Visit (INDEPENDENT_AMBULATORY_CARE_PROVIDER_SITE_OTHER): Payer: No Typology Code available for payment source | Admitting: Family Medicine

## 2015-08-25 VITALS — BP 124/76 | HR 86 | Temp 98.2°F | Resp 14 | Ht 66.0 in | Wt 171.0 lb

## 2015-08-25 DIAGNOSIS — E538 Deficiency of other specified B group vitamins: Secondary | ICD-10-CM

## 2015-08-25 DIAGNOSIS — E7800 Pure hypercholesterolemia, unspecified: Secondary | ICD-10-CM

## 2015-08-25 DIAGNOSIS — E039 Hypothyroidism, unspecified: Secondary | ICD-10-CM

## 2015-08-25 DIAGNOSIS — Z1159 Encounter for screening for other viral diseases: Secondary | ICD-10-CM

## 2015-08-25 DIAGNOSIS — Z0000 Encounter for general adult medical examination without abnormal findings: Secondary | ICD-10-CM | POA: Diagnosis not present

## 2015-08-25 DIAGNOSIS — N952 Postmenopausal atrophic vaginitis: Secondary | ICD-10-CM

## 2015-08-25 DIAGNOSIS — Z780 Asymptomatic menopausal state: Secondary | ICD-10-CM

## 2015-08-25 DIAGNOSIS — F411 Generalized anxiety disorder: Secondary | ICD-10-CM

## 2015-08-25 DIAGNOSIS — R7301 Impaired fasting glucose: Secondary | ICD-10-CM

## 2015-08-25 DIAGNOSIS — G4733 Obstructive sleep apnea (adult) (pediatric): Secondary | ICD-10-CM | POA: Diagnosis not present

## 2015-08-25 DIAGNOSIS — Z23 Encounter for immunization: Secondary | ICD-10-CM | POA: Diagnosis not present

## 2015-08-25 NOTE — Patient Instructions (Signed)
  HEALTH MAINTENANCE RECOMMENDATIONS:  It is recommended that you get at least 30 minutes of aerobic exercise at least 5 days/week (for weight loss, you may need as much as 60-90 minutes). This can be any activity that gets your heart rate up. This can be divided in 10-15 minute intervals if needed, but try and build up your endurance at least once a week.  Weight bearing exercise is also recommended twice weekly.  Eat a healthy diet with lots of vegetables, fruits and fiber.  "Colorful" foods have a lot of vitamins (ie green vegetables, tomatoes, red peppers, etc).  Limit sweet tea, regular sodas and alcoholic beverages, all of which has a lot of calories and sugar.  Up to 1 alcoholic drink daily may be beneficial for women (unless trying to lose weight, watch sugars).  Drink a lot of water.  Calcium recommendations are 1200-1500 mg daily (1500 mg for postmenopausal women or women without ovaries), and vitamin D 1000 IU daily.  This should be obtained from diet and/or supplements (vitamins), and calcium should not be taken all at once, but in divided doses.  Monthly self breast exams and yearly mammograms for women over the age of 76 is recommended.  Sunscreen of at least SPF 30 should be used on all sun-exposed parts of the skin when outside between the hours of 10 am and 4 pm (not just when at beach or pool, but even with exercise, golf, tennis, and yard work!)  Use a sunscreen that says "broad spectrum" so it covers both UVA and UVB rays, and make sure to reapply every 1-2 hours.  Remember to change the batteries in your smoke detectors when changing your clock times in the spring and fall.  Use your seat belt every time you are in a car, and please drive safely and not be distracted with cell phones and texting while driving.   You should be getting a call about scheduling a bone density test at [facility].  If you do not get a call, please call there and schedule one. Please consider  getting another sleep study (contact me to order if not getting it through your psychiatrist).  Schedule your colonoscopy.

## 2015-08-25 NOTE — Progress Notes (Signed)
Chief Complaint  Patient presents with  . CPe and pelvic exam    gets eyes checked at [place] is a 64 y.o. female who presents for a complete physical and follow up on chronic medical problems.  She is currently very stressed, related to her brother in [place] who had psych issues(recently hospitalized, discharged, not on meds, without money, currently without a home).   Seen in February with palpitations.  TSH was checked and was normal; felt to possibly related to anxiety, reflux.  Seemed to be better once she picked up a new prescription (of same dose) for Pristiq.  Atrophic vaginitis:She admits she hasn't used the premarin.  She uses Astroglide which helps some, but sometimes still has pain with intercourse.   We previously discussed Osphena vs topical estrogens (which she has take in past, but not in a long time). She still has occasional hot flashes.  Hyperlipidemia: Treated by Dr. Claiborne Billings. She is on Zetia without side effects. She had myalgias on statins in the past.  Hypothyroidism: On generic thyroid medication. Taking it on an empty stomach, separate from other medications and vitamins. Denies any missed doses. She has noticed a little more hair loss than usual in the last 4-5 months. No changes in bowels, nails, energy, moods.  Seeing psychiatrist--doing well, stable on medications. She continues to see psychiatrist monthly.  Mod OSA (2007 per chart). She was on CPAP for about a month, didn't like it. She doesn't feel refreshed in the mornings, but thinks more related to trouble sleeping, hasn't been sleeping well.  She seems to sleep better if she takes her pristiq at 2pm.  Last year she said her husband stated she no longer snores, but has been using CPAP himself, and she doesn't know if he would know/hear if she was snoring.  We discussed last year having a home sleep study, as well as potential for oral device instead of CPAP, but she  admits she never scheduled the study.    Immunization History  Administered Date(s) Administered  . Influenza Split 11/26/2012  . Influenza,inj,Quad PF,36+ Mos 11/14/2013, 08/14/2014  . Tdap 07/04/2013  . Zoster 07/04/2013   Last Pap smear: s/p hysterectomy Last mammogram: 09/2014 Last colonoscopy: 2011 Dr. Vira Agar in [place]. She got a letter stating she is due again now.  Last DEXA: over 7 years ago, ?slight abnormal, through GYN in [place]. Recommended last year at [facility], but it was never scheduled. Dentist: twice yearly  Ophtho: yearly  Exercise: doing pilates 2x/week and swimming 1-2x/week. Uses equipment with pilates, so has some weight-bearing (resistance). No recent kayaking due to back pain.  She has had some back problems. Got better with pilates, but had a flare a few weeks ago. She has  Been seeing chiro, and is able to be back at pilates, doing better.  Past Medical History  Diagnosis Date  . Vitamin D deficiency   . IBS (irritable bowel syndrome) constipation(dr elliot [place])  . RLS (restless legs syndrome)   . GERD (gastroesophageal reflux disease) HH per pt EGD nl 2/06  . Colon polyps   . Osteopenia hx  . Elevated cholesterol   . OSA (obstructive sleep apnea) moderate, not using CPAP, aware of risks    2007  . Depression panic(dr.william chen)  . Anxiety     GENERALIZED/WILLIAM CHEN MD  . Hypothyroid     Past Surgical History  Procedure Laterality Date  . Abdominal hysterectomy  1999    and  BSO (for benign reasons)  . Colonoscopy  2011    Dr. Vira Agar [facility] [place])  . Appendectomy  1999  . Esophagogastroduodenoscopy  2009    Social History   Social History  . Marital Status: Married    Spouse Name: N/A  . Number of Children: 1  . Years of Education: N/A   Occupational History  . homemaker    Social History Main Topics  . Smoking status: Never Smoker   . Smokeless tobacco: Never Used  . Alcohol Use: Yes      Comment: socially, 1 glass wine bi-weekly  . Drug Use: No  . Sexual Activity:    Partners: Male   Other Topics Concern  . Not on file   Social History Narrative   Lives with husband.  Son lives in [place]    Family History  Problem Relation Age of Onset  . Heart disease Mother   . Cancer Father     stomach and liver  . Sarcoidosis Sister   . Heart disease Brother 65  . Heart disease Maternal Aunt   . Heart disease Paternal Aunt   . Heart disease Paternal Uncle   . Stroke Neg Hx   . Colon cancer Neg Hx   . Depression Brother   . Breast cancer Cousin     50's  . Diabetes Other     gestational    Outpatient Encounter Prescriptions as of 08/25/2015  Medication Sig Note  . b complex vitamins tablet Take 1 tablet by mouth daily.   . Cholecalciferol (VITAMIN D) 2000 UNITS tablet Take 2,000 Units by mouth daily.     Marland Kitchen desvenlafaxine (PRISTIQ) 50 MG 24 hr tablet Take 50 mg by mouth daily.     Marland Kitchen ezetimibe (ZETIA) 10 MG tablet Take 1 tablet (10 mg total) by mouth daily.   Marland Kitchen L-Methylfolate (DEPLIN PO) Take by mouth.   . levothyroxine (SYNTHROID, LEVOTHROID) 25 MCG tablet Take 1 tablet (25 mcg total) by mouth daily.   Marland Kitchen LORazepam (ATIVAN) 0.5 MG tablet Take 0.5 mg by mouth as needed.   11/14/2013: Uses once a week or less  . Magnesium 250 MG TABS Take 1 tablet by mouth daily.     Marland Kitchen conjugated estrogens (PREMARIN) vaginal cream Place one applicatorful per vagina twice weekly (Patient not taking: Reported on 08/25/2015)   . fluticasone (FLONASE) 50 MCG/ACT nasal spray Place 1 spray into both nostrils daily. 08/25/2015: Hasn't needed yet this year  . ranitidine (ZANTAC) 150 MG tablet Take 150 mg by mouth as needed.   08/25/2015: Hasn't been needing  . zolpidem (AMBIEN) 5 MG tablet Take 5 mg by mouth at bedtime as needed.  08/25/2015: Hasn't been using   No facility-administered encounter medications on file as of 08/25/2015.    Allergies  Allergen Reactions  . Zithromax [Azithromycin]  Hives    ROS: The patient denies anorexia, fever, headaches (rare migraines, last was 6 months ago, much better); denies vision changes, ear pain, sore throat, breast concerns, chest pain, palpitations, dizziness, syncope, dyspnea on exertion, cough, swelling, nausea, vomiting, diarrhea, constipation, abdominal pain, melena, hematochezia, hematuria, incontinence, vaginal bleeding, discharge, odor or itch, genital lesions, joint pains, numbness, tingling, weakness, tremor, suspicious skin lesions, depression, abnormal bleeding/bruising, or enlarged lymph nodes.  Some knee pain, intermittent. Also recent LBP recently--see above. Some hearing loss and chronic tinnitus, unchanged. Has seen ENT in the past, plans to go again. Vaginal dryness, unchanged, intercourse is intermittently painful  Moods--stressed; no depression. Ankles swelled  a couple of weeks ago after prolonged standing, when very hot.  PHYSICAL EXAM:  BP 124/76 mmHg  Pulse 86  Temp(Src) 98.2 F (36.8 C) (Tympanic)  Resp 14  Ht 5\' 6"  (1.676 m)  Wt 171 lb (77.565 kg)  BMI 27.61 kg/m2  General Appearance:  Alert, cooperative, no distress, appears stated age   Head:  Normocephalic, without obvious abnormality, atraumatic   Eyes:  PERRL, conjunctiva/corneas clear, EOM's intact, fundi  benign   Ears:  Normal TM's and external ear canals  Nose:  Nares normal, mucosa normal, no drainage or sinus tenderness   Throat:  Lips, mucosa, and tongue normal; teeth and gums normal   Neck:  Supple, no lymphadenopathy; thyroid: no enlargement/tenderness/nodules; no carotid  bruit or JVD   Back:  Spine nontender, no curvature, ROM normal, no CVA tenderness.  Lungs:  Clear to auscultation bilaterally without wheezes, rales or ronchi; respirations unlabored   Chest Wall:  No tenderness or deformity   Heart:  Regular rate and rhythm, S1 and S2 normal, no murmur, rub  or gallop   Breast Exam:  No nipple inversion,  nipple discharge. No dominant masses or axillary lymphadenopathy.   Abdomen:  Soft, non-tender, nondistended, normoactive bowel sounds,  no masses, no hepatosplenomegaly   Genitalia:  normal external genitalia, with mild atrophic changes. Bimanual exam is normal--no appreciable masses. Uterus and ovaries surgically absent.    Rectal exam--normal sphincter tone, heme negative stool  Extremities:  No clubbing, cyanosis or edema   Pulses:  2+ and symmetric all extremities   Skin:  Skin color, texture, turgor normal, no rashes or lesions   Lymph nodes:  Cervical, supraclavicular, and axillary nodes normal   Neurologic:  CNII-XII intact, normal strength, sensation and gait; reflexes 2+ and symmetric throughout    Psych: Normal mood, affect, hygiene and grooming.         Lab Results  Component Value Date   WBC 3.8* 08/21/2015   HGB 14.0 08/21/2015   HCT 42.1 08/21/2015   MCV 93.3 08/21/2015   PLT 262 08/21/2015   Lab Results  Component Value Date   CHOL 199 08/21/2015   HDL 68 08/21/2015   LDLCALC 113 08/21/2015   TRIG 91 08/21/2015   CHOLHDL 2.9 08/21/2015   Lab Results  Component Value Date   TSH 3.079 08/21/2015     Chemistry      Component Value Date/Time   NA 141 08/21/2015 0001   K 4.5 08/21/2015 0001   CL 106 08/21/2015 0001   CO2 26 08/21/2015 0001   BUN 11 08/21/2015 0001   CREATININE 0.99 08/21/2015 0001      Component Value Date/Time   CALCIUM 9.0 08/21/2015 0001   ALKPHOS 68 08/21/2015 0001   AST 23 08/21/2015 0001   ALT 18 08/21/2015 0001   BILITOT 0.6 08/21/2015 0001     Fasting glucose 94  Lab Results  Component Value Date   VITAMINB12 780 08/21/2015   Lab Results  Component Value Date   HGBA1C 5.8* 08/21/2015    ASSESSMENT/PLAN:  Annual physical exam  Need for prophylactic vaccination and inoculation against influenza - Plan: Flu Vaccine QUAD 36+ mos IM  Hypothyroidism, unspecified  hypothyroidism type - adequately replaced  Pure hypercholesterolemia - adequately treated with zetia.  Will forward results to Dr. Claiborne Billings  Anxiety state - stable; some stressors; continue current meds and regular psych follow-up  Impaired fasting glucose - encouraged limiting sweets, daily exercise  B12 deficiency - adequately replaced/resolved  Postmenopausal  atrophic vaginitis - decines topical estrogen; continue lubrication.  Call for refill if she decides to re-try premarin cream  OSA (obstructive sleep apnea) - encouraged repeat sleep study; risks of untreated OSA reviewed   Discussed monthly self breast exams and yearly mammograms; at least 30 minutes of aerobic activity at least 5 days/week, weight-bearing exercises at least 2x/wk; proper sunscreen use reviewed; healthy diet, including goals of calcium and vitamin D intake and alcohol recommendations (less than or equal to 1 drink/day) reviewed; regular seatbelt use; changing batteries in smoke detectors. Immunization recommendations discussed--flu shot today. Colonoscopy recommendations reviewed, due again now.  Encouraged f/u sleep study to re-evaluate for OSA. She plans to talk to the psychiatrist about some sort of program/testing that he had discussed with her at [facility]. If she does this, she will get [place] the results.  Discussed potential risks of untreated OSA.  DEXA--prefers to have done here rather than [place]--ordered at [facility].

## 2015-08-25 NOTE — Telephone Encounter (Signed)
Pt made her CPE for next year on 08/25/16 however she would like to come in for fasting labs prior to the appointment on 08/23/16. Is this OK?

## 2015-08-26 NOTE — Telephone Encounter (Signed)
Future orders were entered.

## 2015-09-17 ENCOUNTER — Telehealth: Payer: Self-pay | Admitting: *Deleted

## 2015-09-17 NOTE — Telephone Encounter (Signed)
Has appt with Dr.Teoh 09/30/15 for hearing screeing, has seen in the past. Thinks her hearing loss is worsening. Has [employer] and needs me to do referral in computer-is this ok with you? Please advise, thanks.

## 2015-09-17 NOTE — Telephone Encounter (Signed)
Done

## 2015-09-17 NOTE — Telephone Encounter (Signed)
Ok for referral?

## 2015-09-30 ENCOUNTER — Encounter: Payer: Self-pay | Admitting: *Deleted

## 2015-09-30 ENCOUNTER — Telehealth: Payer: Self-pay | Admitting: *Deleted

## 2015-09-30 NOTE — Telephone Encounter (Signed)
Per our records (which do not contain the actual sleep study she had in 2007), it shows she has moderate OSA.  This IS recommended to be treated, due to many longterm potential complications (including pulmonary hypertension and other heart risks).  If she questions this, or would like to prove that she does NOT have this degree of sleep apnea, the way to evaluate would be with another sleep study.  We have recommended this at her visits (her physical).  There are treatment options other than CPAP, if she has tried and not tolerated this--ie some dentists make oral appliances, if appropriate. But need to get the sleep study first.  I personally like to order split night tests, where they try and CPAP if you met criteria for OSA in the first half of the night--it saves an extra sleep study if CPAP is going to be the treatment option of choice.

## 2015-09-30 NOTE — Telephone Encounter (Signed)
Patient called and states that her and her husband are trying to get "long term care insurance." They were denied because one of her OV notes states that she has been non-compliant with her CPAP for her moderate OSA. She can appeal this if you feel like you have any further information PP:HKFE. Ie:she really doesn't need to be on CPAP. She may be willing to go ahead and have the new sleep study done is she absolutely has to. Please advise. Thanks.

## 2015-09-30 NOTE — Telephone Encounter (Signed)
Sent My-Chart message

## 2015-10-07 ENCOUNTER — Other Ambulatory Visit: Payer: Self-pay | Admitting: *Deleted

## 2015-10-07 DIAGNOSIS — G4733 Obstructive sleep apnea (adult) (pediatric): Secondary | ICD-10-CM

## 2015-11-05 ENCOUNTER — Other Ambulatory Visit: Payer: Self-pay | Admitting: *Deleted

## 2015-11-05 DIAGNOSIS — G4733 Obstructive sleep apnea (adult) (pediatric): Secondary | ICD-10-CM

## 2015-11-09 ENCOUNTER — Telehealth: Payer: Self-pay | Admitting: Family Medicine

## 2015-11-09 ENCOUNTER — Other Ambulatory Visit: Payer: Self-pay | Admitting: Family Medicine

## 2015-11-09 ENCOUNTER — Encounter: Payer: Self-pay | Admitting: Family Medicine

## 2015-11-09 NOTE — Telephone Encounter (Signed)
Left message for pt to call. She needs to move her 08/25/2016 appt.

## 2015-11-23 ENCOUNTER — Ambulatory Visit (HOSPITAL_BASED_OUTPATIENT_CLINIC_OR_DEPARTMENT_OTHER): Payer: No Typology Code available for payment source | Attending: Family Medicine | Admitting: Radiology

## 2015-11-23 VITALS — Ht 66.0 in | Wt 160.0 lb

## 2015-11-23 DIAGNOSIS — F419 Anxiety disorder, unspecified: Secondary | ICD-10-CM | POA: Diagnosis not present

## 2015-11-23 DIAGNOSIS — R0683 Snoring: Secondary | ICD-10-CM | POA: Diagnosis not present

## 2015-11-23 DIAGNOSIS — G4733 Obstructive sleep apnea (adult) (pediatric): Secondary | ICD-10-CM | POA: Diagnosis present

## 2015-11-23 DIAGNOSIS — R451 Restlessness and agitation: Secondary | ICD-10-CM | POA: Diagnosis not present

## 2015-11-24 ENCOUNTER — Encounter (HOSPITAL_BASED_OUTPATIENT_CLINIC_OR_DEPARTMENT_OTHER): Payer: No Typology Code available for payment source

## 2015-11-28 DIAGNOSIS — R0683 Snoring: Secondary | ICD-10-CM

## 2015-11-28 DIAGNOSIS — G4733 Obstructive sleep apnea (adult) (pediatric): Secondary | ICD-10-CM | POA: Diagnosis not present

## 2015-11-28 DIAGNOSIS — R451 Restlessness and agitation: Secondary | ICD-10-CM

## 2015-11-28 NOTE — Progress Notes (Signed)
  Patient Name: Stacy Carrillo, Arauza Date: 11/23/2015 Gender: Female D.O.B: 12-Feb-1951 Age (years): 64 Referring Provider: Rita Ohara Height (inches): 49 Interpreting Physician: Baird Lyons MD, ABSM Weight (lbs): 160 RPSGT: Jacolyn Reedy BMI: 26 MRN: [id number] Neck Size: 14.00 CLINICAL INFORMATION Sleep Study Type: HST Indication for sleep study: Anxiety, OSA Epworth Sleepiness Score: 2 SLEEP STUDY TECHNIQUE A multi-channel overnight portable sleep study was performed. The channels recorded were: nasal airflow, thoracic respiratory movement, and oxygen saturation with a pulse oximetry. Snoring was also monitored. MEDICATIONS Patient self administered medications include: N/A.  SLEEP ARCHITECTURE Patient was studied for 305.7 minutes. The sleep efficiency was 97.4 % and the patient was supine for 54.2%. The arousal index was 0.0 per hour.  RESPIRATORY PARAMETERS The overall AHI was 2.4 per hour, with a central apnea index of 0.0 per hour. The oxygen nadir was 90% during sleep.  CARDIAC DATA Mean heart rate during sleep was 63.8 bpm. IMPRESSIONS - No significant obstructive sleep apnea occurred during this study (AHI = 2.4/h). - No significant central sleep apnea occurred during this study (CAI = 0.0/h). - The patient had minimal or no oxygen desaturation during the study (Min O2 = 90%) - Patient snored 0.6% during the sleep.  DIAGNOSIS - Normal study  RECOMMENDATIONS - Avoid alcohol, sedatives and other CNS depressants that may worsen sleep apnea and disrupt normal sleep architecture. - Sleep hygiene should be reviewed to assess factors that may improve sleep quality. - Weight management and regular exercise should be initiated or continued. Deneise Lever Diplomate, American Board of Sleep Medicine  ELECTRONICALLY SIGNED ON:  11/28/2015, 11:49 AM [place] PH: [phone]   FX: [phone] [place]

## 2015-11-30 ENCOUNTER — Encounter: Payer: Self-pay | Admitting: Family Medicine

## 2015-12-09 ENCOUNTER — Encounter: Payer: Self-pay | Admitting: Family Medicine

## 2015-12-12 ENCOUNTER — Other Ambulatory Visit: Payer: Self-pay | Admitting: Cardiovascular Disease

## 2015-12-14 NOTE — Telephone Encounter (Signed)
Rx(s) sent to pharmacy electronically.  

## 2015-12-28 ENCOUNTER — Encounter: Payer: Self-pay | Admitting: Cardiovascular Disease

## 2015-12-28 ENCOUNTER — Ambulatory Visit (INDEPENDENT_AMBULATORY_CARE_PROVIDER_SITE_OTHER): Payer: BLUE CROSS/BLUE SHIELD | Admitting: Cardiovascular Disease

## 2015-12-28 VITALS — BP 104/80 | HR 74 | Ht 66.5 in | Wt 176.1 lb

## 2015-12-28 DIAGNOSIS — E0590 Thyrotoxicosis, unspecified without thyrotoxic crisis or storm: Secondary | ICD-10-CM

## 2015-12-28 DIAGNOSIS — G4733 Obstructive sleep apnea (adult) (pediatric): Secondary | ICD-10-CM | POA: Diagnosis not present

## 2015-12-28 DIAGNOSIS — E785 Hyperlipidemia, unspecified: Secondary | ICD-10-CM

## 2015-12-28 DIAGNOSIS — R002 Palpitations: Secondary | ICD-10-CM

## 2015-12-28 DIAGNOSIS — Z8249 Family history of ischemic heart disease and other diseases of the circulatory system: Secondary | ICD-10-CM | POA: Diagnosis not present

## 2015-12-28 DIAGNOSIS — R7301 Impaired fasting glucose: Secondary | ICD-10-CM

## 2015-12-28 DIAGNOSIS — Z79899 Other long term (current) drug therapy: Secondary | ICD-10-CM

## 2015-12-28 DIAGNOSIS — F411 Generalized anxiety disorder: Secondary | ICD-10-CM

## 2015-12-28 MED ORDER — EZETIMIBE 10 MG PO TABS: 10.0000 mg | ORAL_TABLET | Freq: Every day | ORAL | Status: DC

## 2015-12-28 MED ORDER — METOPROLOL SUCCINATE ER 25 MG PO TB24: ORAL_TABLET | ORAL | Status: DC

## 2015-12-28 NOTE — Patient Instructions (Signed)
Your physician recommends that you return for lab work fasting.  Your physician has recommended you make the following change in your medication: start new metoprolol succ 25 mg prescription as directed on the bottle. This has already been sent to your pharmacy.   Your physician recommends that you schedule a follow-up appointment in: 3 months with Dr Claiborne Billings.

## 2015-12-30 ENCOUNTER — Encounter: Payer: Self-pay | Admitting: Cardiovascular Disease

## 2015-12-30 DIAGNOSIS — R002 Palpitations: Secondary | ICD-10-CM | POA: Insufficient documentation

## 2015-12-30 NOTE — Progress Notes (Signed)
Patient ID: Stacy Carrillo, female   DOB: 03-Jun-1951, 65 y.o.   MRN: [id number]    Primary MD: Dr. Rita Ohara  HPI: Stacy Carrillo is a 65 y.o. female who presents for fan 13 month ollowup cardiologic evaluation.   Stacy Carrillo has a strong family history for CAD as well as hyperlipidemia. In June 2013 her total cholesterol was 273 with an LDL of 186 HDL 16 and triglycerides 94. An NMR profile showed an LDL particle #1940. She has a history of hypothyroidism for which she's been on Synthroid replacement as well as migraine headaches. When I saw her in November 2013 and obtained a [facility] lab assessment with genetic profile I recommended at least a trial of Zetia in light of her marked hyperlipidemia.  She was against statin initiation and did not want to initiate therapy Zetia and sought nutritional guidance.  She initially increased increased her exercise and had approximately a 30 pound weight loss over a year.  A repeat NMR profile on 05/09/2012: total cholesterol was still elevated at 222, HDL was excellent at 71 LDL was 137. Her LDL small particles were normal at 433 but she still had increased LDL particle number of 1578 nmol per liter.  As result, I recommend initiation of Zetia 10 mg, which she conceded to initiate.  Repeat laboratory in September 2015  showed a total cholesterol 183, triglycerides 75, HDL 70, VLDL 15, and LDL cholesterol at 98.  A single blood sugar was 101.  She normal renal function.  She normal electrolytes.  Thyroid function was normal.  When I saw her last year she  had difficulty with musculoskeletal issues.  She has had hip pain and is diagnosed with an external oblique tear as well as gluteal tear.  She has not been able to exercise as regularly.     since I last saw her, she has remained active.  She is exercising and doing Pilates at least 3 days per week.  She does have some issues with anxiety. She has occasional chest fluttering and heart skipping. She  underwent a sleep study and was  told of not having obstructive sleep apnea. I have personally reviewed this study which was done at [facility] on 11/23/2015. Her AHI was 2.4  She slept with 97.4% sleep efficiency. Her oxygen saturation nadir was 90%. She denies any chest pain.  She denies shortness of breath.  She presents for evaluation.    Past Medical History  Diagnosis Date  . Vitamin D deficiency   . IBS (irritable bowel syndrome) constipation(dr elliot [place])  . RLS (restless legs syndrome)   . GERD (gastroesophageal reflux disease) HH per pt EGD nl 2/06  . Colon polyps   . Osteopenia hx  . Elevated cholesterol   . OSA (obstructive sleep apnea) moderate, not using CPAP, aware of risks    2007; sleep study 11/2015 NORMAL  . Depression panic(dr.[email or web address])  . Anxiety     GENERALIZED/WILLIAM CHEN MD  . Hypothyroid     Past Surgical History  Procedure Laterality Date  . Abdominal hysterectomy  1999    and BSO (for benign reasons)  . Colonoscopy  2011    Dr. Vira Agar [facility] [place])  . Appendectomy  1999  . Esophagogastroduodenoscopy  2009    Allergies  Allergen Reactions  . Zithromax [Azithromycin] Hives    Current Outpatient Prescriptions  Medication Sig Dispense Refill  . b complex vitamins tablet Take 1 tablet by mouth daily.    Marland Kitchen  Cholecalciferol (VITAMIN D) 2000 UNITS tablet Take 2,000 Units by mouth daily.      Marland Kitchen conjugated estrogens (PREMARIN) vaginal cream Place one applicatorful per vagina twice weekly 42.5 g 1  . desvenlafaxine (PRISTIQ) 50 MG 24 hr tablet Take 50 mg by mouth daily.      Marland Kitchen ezetimibe (ZETIA) 10 MG tablet Take 1 tablet (10 mg total) by mouth daily. 30 tablet 11  . fluticasone (FLONASE) 50 MCG/ACT nasal spray Place 1 spray into both nostrils daily.    Marland Kitchen L-Methylfolate (DEPLIN PO) Take by mouth.    Marland Kitchen L-Methylfolate-Algae 7.5-90.314 MG CAPS Take 1 capsule by mouth daily. Take 1 cap daily    . levothyroxine (SYNTHROID,  LEVOTHROID) 25 MCG tablet TAKE 1 TABLET (25 MCG TOTAL) BY MOUTH DAILY. 90 tablet 2  . LORazepam (ATIVAN) 0.5 MG tablet Take 0.5 mg by mouth as needed.      . Magnesium 250 MG TABS Take 1 tablet by mouth daily.      . ondansetron (ZOFRAN-ODT) 4 MG disintegrating tablet Take 1 tablet by mouth once. Use for colon prep    . ranitidine (ZANTAC) 150 MG tablet Take 150 mg by mouth as needed.      Marland Kitchen rOPINIRole (REQUIP) 0.25 MG tablet Take 0.25 mg by mouth 3 (three) times a week. Take 1 tab by mouth 1-2 hours before bed time, may increase to 5 tabs daily    . metoprolol succinate (TOPROL XL) 25 MG 24 hr tablet TAKE 1/2 TABLET FOR 2 WEEKS THEN INCREASE TO 1 TABLET DAILY 30 tablet 6   No current facility-administered medications for this visit.    Socially she is married  To Stacy Carrillo. Days per week with a trainer 45 minutes at a time. Is no tobacco or alcohol use.   ROS General: Negative; No fevers, chills, or night sweats; positive for weight gain HEENT: Negative; No changes in vision or hearing, sinus congestion, difficulty swallowing Pulmonary: Negative; No cough, wheezing, shortness of breath, hemoptysis Cardiovascular: Negative; No chest pain, presyncope, syncope, palpitations GI: Negative; No nausea, vomiting, diarrhea, or abdominal pain GU: Negative; No dysuria, hematuria, or difficulty voiding Musculoskeletal: Positive for recent external oblique tear as well as gluteus tear Hematologic/Oncology: Negative; no easy bruising, bleeding Endocrine: She had been started on low-dose levothyroxine 25 g.  No diabetes Neuro: Negative; no changes in balance, headaches Skin: Negative; No rashes or skin lesions Psychiatric: Positive for mild anxiety Sleep:  Positive for restless legs; No snoring, daytime sleepiness, hypersomnolence, bruxism, hypnogognic hallucinations, no cataplexy Other comprehensive 14 point system review is negative.   PE BP 104/80 mmHg  Pulse 74  Ht 5' 6.5" (1.689 m)  Wt  176 lb 2 oz (79.89 kg)  BMI 28.00 kg/m2  Wt Readings from Last 3 Encounters:  12/28/15 176 lb 2 oz (79.89 kg)  11/24/15 160 lb (72.576 kg)  08/25/15 171 lb (77.565 kg)   General: Alert, oriented, no distress.  Skin: normal turgor, no rashes HEENT: Normocephalic, atraumatic. Pupils round and reactive; sclera anicteric;no lid lag,  Nose without nasal septal hypertrophy Mouth/Parynx benign; Mallinpatti scale 2 Neck: No JVD, no carotid bruits with normal carotid upstroke Lungs: clear to ausculatation and percussion; no wheezing or rales Heart: RRR, s1 s2 normal; 1/6 systolic murmur; no diastolic murmur. Abdomen: soft, nontender; no hepatosplenomehaly, BS+; abdominal aorta nontender and not dilated by palpation. Pulses 2+ Extremities: no clubbing cyanosis or edema, Homan's sign negative  Neurologic: grossly nonfocal Psychological: Normal affect and mood  ECG (independently  read by me): normal sinus rhythm at 74 bpm.  Normal intervals.  December 2015 ECG (and apparently read by me):  Normal sinus rhythm at 75 bpm.  No ectopy.  Normal intervals.  Prior June 2014 ECG: Normal sinus rhythm at 65 beats per minute   LABS: BMP Latest Ref Rng 08/21/2015 01/08/2015 08/19/2014  Glucose 65 - 99 mg/dL 94 94 101(H)  BUN 7 - 25 mg/dL _0 Creatinine 0.50 - 0.99 mg/dL 0.99 0.94 0.94  Sodium 135 - 146 mmol/L 141 139 139  Potassium 3.5 - 5.3 mmol/L 4.5 4.1 4.4  Chloride 98 - 110 mmol/L 106 104 106  CO2 20 - 31 mmol/L _1 Calcium 8.6 - 10.4 mg/dL 9.0 9.4 9.4   Hepatic Function Latest Ref Rng 08/21/2015 08/19/2014 07/03/2013  Total Protein 6.1 - 8.1 g/dL 6.5 6.9 6.5  Albumin 3.6 - 5.1 g/dL 4.1 4.1 4.0  AST 10 - 35 U/L _2 ALT 6 - 29 U/L _3 Alk Phosphatase 33 - 130 U/L 68 52 45  Total Bilirubin 0.2 - 1.2 mg/dL 0.6 0.4 0.5   CBC Latest Ref Rng 08/21/2015 08/19/2014 05/24/2012  WBC 4.0 - 10.5 K/uL 3.8(L) 4.1 5.6  Hemoglobin 12.0 - 15.0 g/dL 14.0 13.8 15.0  Hematocrit 36.0 - 46.0 %  42.1 39.6 45.2  Platelets 150 - 400 K/uL 262 227 248   Lab Results  Component Value Date   MCV 93.3 08/21/2015   MCV 89.2 08/19/2014   MCV 91.9 05/24/2012   Lab Results  Component Value Date   TSH 3.079 08/21/2015   Lab Results  Component Value Date   HGBA1C 5.8* 08/21/2015   Lipid Panel     Component Value Date/Time   CHOL 199 08/21/2015 0001   CHOL 222* 05/09/2013 1117   TRIG 91 08/21/2015 0001   TRIG 70 05/09/2013 1117   HDL 68 08/21/2015 0001   HDL 71 05/09/2013 1117   CHOLHDL 2.9 08/21/2015 0001   VLDL 18 08/21/2015 0001   LDLCALC 113 08/21/2015 0001   LDLCALC 137* 05/09/2013 1117     RADIOLOGY: No results found.    ASSESSMENT AND PLAN: Stacy Carrillo is a 65 year old WF who has a history of hyperlipidemia and has refused consideration to take statins but has been tolerating Zetia 10 mg.  Initially, she was very hesitant to initiate any therapy for hyperlipidemia but after nutritional guidance despite weight loss she ultimately consented to initiate therapy.  She has tolerated Zetia without side effects. I reviewed blood work that she had done in September 2016.  Her lipid studies at that time were improved from previously but remain suboptimal with an LDL of 113. Hemoglobin A1c was minimally elevated at 5.8. She has experienced episodes of palpitations and chest fluttering.  I elected to give her a trial of very low-Toprol-XL 12.5 mg daily and as long as her blood pressure tolerates it if she continues to experience palpitations she can increase this to 25 mg. I reviewed her sleep study  with her. I suspect she may have increased upper airway resistance syndrome but did not have overt sleep apnea. She is bothered by restless legs and has been on requip.  I have recommended that she have follow-up blood work in several weeks. I will contact her regarding results to see if adjustments to her medical regimen will be necessary.  She will be undergoing a colonoscopy in February  2017. I will see her  in 3 months for reevaluation.   Time spent: 25 minutes  Troy Sine, MD, [facility]  12/30/2015 7:19 PM

## 2016-01-15 ENCOUNTER — Telehealth: Payer: Self-pay | Admitting: Family Medicine

## 2016-01-15 NOTE — Telephone Encounter (Signed)
Received signed request from mutual of [place]. Records faxed to [phone].

## 2016-01-20 ENCOUNTER — Encounter: Payer: Self-pay | Admitting: Certified Registered"

## 2016-01-20 ENCOUNTER — Ambulatory Visit
Admission: RE | Admit: 2016-01-20 | Discharge: 2016-01-20 | Disposition: A | Discharge status: Home / Self Care | Payer: BLUE CROSS/BLUE SHIELD | Source: Ambulatory Visit | Attending: Unknown Physician Specialty | Admitting: Unknown Physician Specialty

## 2016-01-20 ENCOUNTER — Encounter
Admission: RE | Disposition: A | Discharge status: Home / Self Care | Payer: Self-pay | Source: Ambulatory Visit | Attending: Unknown Physician Specialty

## 2016-01-20 ENCOUNTER — Encounter: Payer: Self-pay | Admitting: *Deleted

## 2016-01-20 DIAGNOSIS — Z881 Allergy status to other antibiotic agents status: Secondary | ICD-10-CM | POA: Insufficient documentation

## 2016-01-20 DIAGNOSIS — E7800 Pure hypercholesterolemia, unspecified: Secondary | ICD-10-CM | POA: Diagnosis not present

## 2016-01-20 DIAGNOSIS — K589 Irritable bowel syndrome without diarrhea: Secondary | ICD-10-CM | POA: Diagnosis not present

## 2016-01-20 DIAGNOSIS — D120 Benign neoplasm of cecum: Secondary | ICD-10-CM | POA: Diagnosis not present

## 2016-01-20 DIAGNOSIS — F419 Anxiety disorder, unspecified: Secondary | ICD-10-CM | POA: Diagnosis not present

## 2016-01-20 DIAGNOSIS — G2581 Restless legs syndrome: Secondary | ICD-10-CM | POA: Diagnosis not present

## 2016-01-20 DIAGNOSIS — E559 Vitamin D deficiency, unspecified: Secondary | ICD-10-CM | POA: Diagnosis not present

## 2016-01-20 DIAGNOSIS — Z7951 Long term (current) use of inhaled steroids: Secondary | ICD-10-CM | POA: Insufficient documentation

## 2016-01-20 DIAGNOSIS — K640 First degree hemorrhoids: Secondary | ICD-10-CM | POA: Insufficient documentation

## 2016-01-20 DIAGNOSIS — K219 Gastro-esophageal reflux disease without esophagitis: Secondary | ICD-10-CM | POA: Insufficient documentation

## 2016-01-20 DIAGNOSIS — G4733 Obstructive sleep apnea (adult) (pediatric): Secondary | ICD-10-CM | POA: Insufficient documentation

## 2016-01-20 DIAGNOSIS — Z79899 Other long term (current) drug therapy: Secondary | ICD-10-CM | POA: Diagnosis not present

## 2016-01-20 DIAGNOSIS — F329 Major depressive disorder, single episode, unspecified: Secondary | ICD-10-CM | POA: Diagnosis not present

## 2016-01-20 DIAGNOSIS — Z1211 Encounter for screening for malignant neoplasm of colon: Secondary | ICD-10-CM | POA: Insufficient documentation

## 2016-01-20 DIAGNOSIS — D122 Benign neoplasm of ascending colon: Secondary | ICD-10-CM | POA: Insufficient documentation

## 2016-01-20 DIAGNOSIS — E039 Hypothyroidism, unspecified: Secondary | ICD-10-CM | POA: Diagnosis not present

## 2016-01-20 HISTORY — PX: COLONOSCOPY WITH PROPOFOL: SHX5780

## 2016-01-20 SURGERY — COLONOSCOPY WITH PROPOFOL
Anesthesia: General

## 2016-01-20 MED ORDER — FENTANYL CITRATE (PF) 100 MCG/2ML IJ SOLN
INTRAMUSCULAR | Status: DC | PRN
  Administered 2016-01-20: 75 ug via INTRAVENOUS
  Administered 2016-01-20 (×2): 25 ug via INTRAVENOUS

## 2016-01-20 MED ORDER — FENTANYL CITRATE (PF) 100 MCG/2ML IJ SOLN
INTRAMUSCULAR | Status: AC
  Filled 2016-01-20: qty 4

## 2016-01-20 MED ORDER — MIDAZOLAM HCL 5 MG/5ML IJ SOLN
INTRAMUSCULAR | Status: DC | PRN
  Administered 2016-01-20: 2 mg via INTRAVENOUS
  Administered 2016-01-20: 3 mg via INTRAVENOUS
  Administered 2016-01-20: 2 mg via INTRAVENOUS

## 2016-01-20 MED ORDER — PROMETHAZINE HCL 25 MG/ML IJ SOLN
12.5000 mg | Freq: Once | INTRAMUSCULAR | Status: AC
Start: 2016-01-20 — End: 2016-01-20
  Administered 2016-01-20: 12.5 mg via INTRAVENOUS
  Filled 2016-01-20: qty 1

## 2016-01-20 MED ORDER — MIDAZOLAM HCL 5 MG/5ML IJ SOLN
INTRAMUSCULAR | Status: AC
  Filled 2016-01-20: qty 10

## 2016-01-20 MED ORDER — SODIUM CHLORIDE 0.9 % IV SOLN
INTRAVENOUS | Status: DC
  Administered 2016-01-20: 1000 mL via INTRAVENOUS

## 2016-01-20 MED ORDER — SODIUM CHLORIDE 0.9 % IV SOLN: INTRAVENOUS | Status: DC

## 2016-01-20 NOTE — Op Note (Signed)
[facility] Patient Name: Stacy Carrillo Procedure Date: 01/20/2016 11:22 AM MRN: [id number] Account #: [id number] Date of Birth: 03-Dec-1951 Admit Type: Outpatient Age: 65 Room: [facility] ENDO ROOM 4 Gender: Female Note Status: Finalized Procedure:            Colonoscopy Indications:          High risk colon cancer surveillance: Personal history                        of colonic polyps Providers:            Manya Silvas, MD Referring MD:         Vikki Ports (Referring MD) Medicines:            Propofol per Anesthesia, Fentanyl 125 micrograms IV,                        Midazolam 7 mg IV, Promethazine [id number] mg IV Complications:        No immediate complications. Procedure:            Pre-Anesthesia Assessment:                       - After reviewing the risks and benefits, the patient                        was deemed in satisfactory condition to undergo the                        procedure.                       After obtaining informed consent, the colonoscope was                        passed under direct vision. Throughout the procedure,                        the patient's blood pressure, pulse, and oxygen                        saturations were monitored continuously. The                        Colonoscope was introduced through the anus and                        advanced to the the cecum, identified by appendiceal                        orifice and ileocecal valve. The colonoscopy was                        performed without difficulty. The patient tolerated the                        procedure well. The quality of the bowel preparation                        was excellent. Findings:      Two sessile polyps were found in the ascending colon and cecum. The  polyps were diminutive in size. These polyps were removed with a cold       biopsy forceps. Resection and retrieval were complete.      Internal hemorrhoids were found during  endoscopy. The hemorrhoids were       small and Grade I (internal hemorrhoids that do not prolapse).      The exam was otherwise without abnormality. Impression:           - Two diminutive polyps in the ascending colon and in                        the cecum, removed with a cold biopsy forceps. Resected                        and retrieved.                       - Internal hemorrhoids.                       - The examination was otherwise normal. Recommendation:       - Await pathology results. Manya Silvas, MD 01/20/2016 12:01:29 PM This report has been signed electronically. Number of Addenda: 0 Note Initiated On: 01/20/2016 11:22 AM Scope Withdrawal Time: 0 hours 14 minutes 50 seconds  Total Procedure Duration: 0 hours 25 minutes 11 seconds       [facility]

## 2016-01-20 NOTE — H&P (Signed)
Primary Care Physician:  Vikki Ports, MD Primary Gastroenterologist:  Dr. Vira Agar  Pre-Procedure History & Physical: HPI:  Stacy Carrillo is a 64 y.o. female is here for an colonoscopy.   Past Medical History  Diagnosis Date  . Vitamin D deficiency   . IBS (irritable bowel syndrome) constipation(dr elliot [place])  . RLS (restless legs syndrome)   . GERD (gastroesophageal reflux disease) HH per pt EGD nl 2/06  . Colon polyps   . Osteopenia hx  . Elevated cholesterol   . OSA (obstructive sleep apnea) moderate, not using CPAP, aware of risks    2007; sleep study 11/2015 NORMAL  . Depression panic(dr.[email or web address] chen)  . Anxiety     GENERALIZED/WILLIAM CHEN MD  . Hypothyroid     Past Surgical History  Procedure Laterality Date  . Abdominal hysterectomy  1999    and BSO (for benign reasons)  . Colonoscopy  2011    Dr. Vira Agar [facility] [place])  . Appendectomy  1999  . Esophagogastroduodenoscopy  2009  . Tonsillectomy      Prior to Admission medications   Medication Sig Start Date End Date Taking? Authorizing Provider  ondansetron (ZOFRAN-ODT) 4 MG disintegrating tablet Take 1 tablet by mouth once. Use for colon prep 12/25/15  Yes Historical Provider, MD  b complex vitamins tablet Take 1 tablet by mouth daily.    Historical Provider, MD  Cholecalciferol (VITAMIN D) 2000 UNITS tablet Take 2,000 Units by mouth daily.      Historical Provider, MD  conjugated estrogens (PREMARIN) vaginal cream Place one applicatorful per vagina twice weekly 08/14/14   Rita Ohara, MD  desvenlafaxine (PRISTIQ) 50 MG 24 hr tablet Take 50 mg by mouth daily.      Historical Provider, MD  ezetimibe (ZETIA) 10 MG tablet Take 1 tablet (10 mg total) by mouth daily. 12/28/15   Troy Sine, MD  fluticasone (FLONASE) 50 MCG/ACT nasal spray Place 1 spray into both nostrils daily.    Historical Provider, MD  L-Methylfolate (DEPLIN PO) Take by mouth.    Historical Provider, MD   L-Methylfolate-Algae 7.5-90.314 MG CAPS Take 1 capsule by mouth daily. Take 1 cap daily    Historical Provider, MD  levothyroxine (SYNTHROID, LEVOTHROID) 25 MCG tablet TAKE 1 TABLET (25 MCG TOTAL) BY MOUTH DAILY. 11/09/15   Rita Ohara, MD  LORazepam (ATIVAN) 0.5 MG tablet Take 0.5 mg by mouth as needed.      Historical Provider, MD  Magnesium 250 MG TABS Take 1 tablet by mouth daily.      Historical Provider, MD  metoprolol succinate (TOPROL XL) 25 MG 24 hr tablet TAKE 1/2 TABLET FOR 2 WEEKS THEN INCREASE TO 1 TABLET DAILY 12/28/15   Troy Sine, MD  ranitidine (ZANTAC) 150 MG tablet Take 150 mg by mouth as needed.      Historical Provider, MD  rOPINIRole (REQUIP) 0.25 MG tablet Take 0.25 mg by mouth 3 (three) times a week. Take 1 tab by mouth 1-2 hours before bed time, may increase to 5 tabs daily    Historical Provider, MD    Allergies as of 01/12/2016 - Review Complete 12/30/2015  Allergen Reaction Noted  . Zithromax [azithromycin] Hives 04/18/2011    Family History  Problem Relation Age of Onset  . Heart disease Mother   . Cancer Father     stomach and liver  . Sarcoidosis Sister   . Heart disease Brother 47  . Heart disease Maternal Aunt   . Heart disease Paternal  Aunt   . Heart disease Paternal Uncle   . Stroke Neg Hx   . Colon cancer Neg Hx   . Depression Brother   . Breast cancer Cousin     50's  . Diabetes Other     gestational    Social History   Social History  . Marital Status: Married    Spouse Name: N/A  . Number of Children: 1  . Years of Education: N/A   Occupational History  . homemaker    Social History Main Topics  . Smoking status: Never Smoker   . Smokeless tobacco: Never Used  . Alcohol Use: Yes     Comment: socially, 1 glass wine bi-weekly  . Drug Use: No  . Sexual Activity:    Partners: Male   Other Topics Concern  . Not on file   Social History Narrative   Lives with husband.  Son lives in [place]: See HPI,  otherwise negative ROS  Physical Exam: BP 111/86 mmHg  Pulse 95  Temp(Src) 98 F (36.7 C) (Tympanic)  Resp 16  Ht 5' 6.5" (1.689 m)  Wt 72.576 kg (160 lb)  BMI 25.44 kg/m2  SpO2 100% General:   Alert,  pleasant and cooperative in NAD Head:  Normocephalic and atraumatic. Neck:  Supple; no masses or thyromegaly. Lungs:  Clear throughout to auscultation.    Heart:  Regular rate and rhythm. Abdomen:  Soft, nontender and nondistended. Normal bowel sounds, without guarding, and without rebound.   Neurologic:  Alert and  oriented x4;  grossly normal neurologically.  Impression/Plan: Stacy Carrillo is here for an colonoscopy to be performed for [facility] colon polyps  Risks, benefits, limitations, and alternatives regarding  colonoscopy have been reviewed with the patient.  Questions have been answered.  All parties agreeable.   Gaylyn Cheers, MD  01/20/2016, 11:20 AM

## 2016-01-21 LAB — SURGICAL PATHOLOGY

## 2016-01-24 ENCOUNTER — Encounter: Payer: Self-pay | Admitting: Unknown Physician Specialty

## 2016-03-28 ENCOUNTER — Encounter: Payer: Self-pay | Admitting: Cardiovascular Disease

## 2016-03-28 ENCOUNTER — Ambulatory Visit (INDEPENDENT_AMBULATORY_CARE_PROVIDER_SITE_OTHER): Payer: BLUE CROSS/BLUE SHIELD | Admitting: Cardiovascular Disease

## 2016-03-28 VITALS — BP 118/79 | HR 74 | Ht 66.0 in | Wt 180.0 lb

## 2016-03-28 DIAGNOSIS — E785 Hyperlipidemia, unspecified: Secondary | ICD-10-CM | POA: Diagnosis not present

## 2016-03-28 DIAGNOSIS — R002 Palpitations: Secondary | ICD-10-CM | POA: Diagnosis not present

## 2016-03-28 DIAGNOSIS — E039 Hypothyroidism, unspecified: Secondary | ICD-10-CM | POA: Diagnosis not present

## 2016-03-28 NOTE — Progress Notes (Signed)
Patient ID: Stacy Carrillo, female   DOB: [birth year], 65 y.o.   MRN: [id number]    Primary MD: Dr. Joselyn Arrow  HPI: Stacy Carrillo is a 65 y.o. female who presents for a 3 month followup cardiologic evaluation.   Stacy Carrillo has a strong family history for CAD as well as hyperlipidemia. In June 2013 her total cholesterol was 273 with an LDL of 186 HDL 16 and triglycerides 94. An NMR profile showed an LDL particle #1940. She has a history of hypothyroidism on Synthroid replacement as well as migraine headaches. When I saw her in November 2013 I obtained a [facility] lab assessment with genetic profile I recommended at least a trial of Zetia in light of her marked hyperlipidemia.  She was against statin initiation and did not want to initiate therapy Zetia and sought nutritional guidance.  She significantly increased her exercise and lost approximately a 30 pound weight loss over a year.  A repeat NMR profile on 05/09/2012: total cholesterol was still elevated at 222, HDL was excellent at 71 LDL was 137. Her LDL small particles were normal at 433 but she still had increased LDL particle number of 1578 nmol per liter.  As result, I recommend initiation of Zetia 10 mg, which she conceded to initiate.  An echo Doppler study in September 2013 was essentially normal and showed an EF of greater than 55%.  There was trace tricuspid regurgitation.  She had normal right ventricular systolic pressure.  Repeat laboratory in September 2015  showed a total cholesterol 183, triglycerides 75, HDL 70, VLDL 15, and LDL cholesterol at 98.  A single blood sugar was 101.  She normal renal function.  She normal electrolytes.  Thyroid function was normal.  When I saw her last year she  had difficulty with musculoskeletal issues.  She has had hip pain and is diagnosed with an external oblique tear as well as gluteal tear.  She has not been able to exercise as regularly.    She has remained active.  She is exercising  and doing Pilates at least 3 days per week.  She does have some issues with anxiety. She has occasional chest fluttering and heart skipping. She underwent a sleep study and was  told of not having obstructive sleep apnea. I have personally reviewed this study which was done at [facility] on 11/23/2015. Her AHI was 2.4  She slept with 97.4% sleep efficiency. Her oxygen saturation nadir was 90%. She denies any chest pain.    Cause of her palpitations, we'll I saw her 3 months ago, I initiated low-dose Toprol-XL at 12.5 mg and stated that if her chest fluttering persisted.  She could increase this to 25 mg.  She has felt improved and rarely notes fluttering which lasts less than a minute and are unassociated with any presyncope or syncope.  She denies any chest pain.  She has started to try to lose weight again and was using an isogenic diet.  She does not feel that this has been successful.  She denies shortness of breath.  She presents for reevaluation.   Past Medical History  Diagnosis Date  . Vitamin D deficiency   . IBS (irritable bowel syndrome) constipation(dr elliot Sherman)  . RLS (restless legs syndrome)   . GERD (gastroesophageal reflux disease) HH per pt EGD nl 2/06  . Colon polyps   . Osteopenia hx  . Elevated cholesterol   . OSA (obstructive sleep apnea) moderate, not using CPAP,  aware of risks    2007; sleep study 11/2015 NORMAL  . Depression panic(dr.[email or web address] chen)  . Anxiety     GENERALIZED/WILLIAM CHEN MD  . Hypothyroid     Past Surgical History  Procedure Laterality Date  . Abdominal hysterectomy  1999    and BSO (for benign reasons)  . Colonoscopy  2011    Dr. Vira Agar [facility] [place])  . Appendectomy  1999  . Esophagogastroduodenoscopy  2009  . Tonsillectomy    . Colonoscopy with propofol N/A 01/20/2016    Procedure: COLONOSCOPY WITH PROPOFOL;  Surgeon: Manya Silvas, MD;  Location: [facility] ENDOSCOPY;  Service: Endoscopy;  Laterality: N/A;     Allergies  Allergen Reactions  . Zithromax [Azithromycin] Hives    Current Outpatient Prescriptions  Medication Sig Dispense Refill  . b complex vitamins tablet Take 1 tablet by mouth daily.    . Cholecalciferol (VITAMIN D) 2000 UNITS tablet Take 2,000 Units by mouth daily.      Marland Kitchen desvenlafaxine (PRISTIQ) 50 MG 24 hr tablet Take 50 mg by mouth daily.      Marland Kitchen ezetimibe (ZETIA) 10 MG tablet Take 1 tablet (10 mg total) by mouth daily. 30 tablet 11  . fluticasone (FLONASE) 50 MCG/ACT nasal spray Place 1 spray into both nostrils daily.    Marland Kitchen L-Methylfolate (DEPLIN PO) Take by mouth.    Marland Kitchen L-Methylfolate-Algae 7.5-90.314 MG CAPS Take 1 capsule by mouth daily. Take 1 cap daily    . levothyroxine (SYNTHROID, LEVOTHROID) 25 MCG tablet TAKE 1 TABLET (25 MCG TOTAL) BY MOUTH DAILY. 90 tablet 2  . LORazepam (ATIVAN) 0.5 MG tablet Take 0.5 mg by mouth as needed.      . Magnesium 250 MG TABS Take 1 tablet by mouth daily.      . metoprolol succinate (TOPROL XL) 25 MG 24 hr tablet TAKE 1/2 TABLET FOR 2 WEEKS THEN INCREASE TO 1 TABLET DAILY 30 tablet 6  . ondansetron (ZOFRAN-ODT) 4 MG disintegrating tablet Take 1 tablet by mouth once. Use for colon prep    . ranitidine (ZANTAC) 150 MG tablet Take 150 mg by mouth as needed.      Marland Kitchen rOPINIRole (REQUIP) 0.25 MG tablet Take 0.25 mg by mouth 3 (three) times a week. Take 1 tab by mouth 1-2 hours before bed time, may increase to 5 tabs daily     No current facility-administered medications for this visit.    Socially she is married to Stacy Carrillo.  There is no tobacco use.  She does exercise and performs Pilates 3 days per week.  ROS General: Negative; No fevers, chills, or night sweats; positive for weight gain HEENT: Negative; No changes in vision or hearing, sinus congestion, difficulty swallowing Pulmonary: Negative; No cough, wheezing, shortness of breath, hemoptysis Cardiovascular: See history of present illness GI: Negative; No nausea, vomiting,  diarrhea, or abdominal pain GU: Negative; No dysuria, hematuria, or difficulty voiding Musculoskeletal: Positive for recent external oblique tear as well as gluteus tear Hematologic/Oncology: Negative; no easy bruising, bleeding Endocrine: She had been started on low-dose levothyroxine 25 g.  No diabetes Neuro: Negative; no changes in balance, headaches Skin: Negative; No rashes or skin lesions Psychiatric: Positive for mild anxiety Sleep:  Positive for restless legs; No snoring, daytime sleepiness, hypersomnolence, bruxism, hypnogognic hallucinations, no cataplexy Other comprehensive 14 point system review is negative.   PE BP 118/79 mmHg  Pulse 74  Ht '5\' 6"'$  (1.676 m)  Wt 180 lb (81.647 kg)  BMI 29.07 kg/m2  Repeat blood pressure by me 118/78  Wt Readings from Last 3 Encounters:  03/28/16 180 lb (81.647 kg)  01/20/16 160 lb (72.576 kg)  12/28/15 176 lb 2 oz (79.89 kg)   General: Alert, oriented, no distress.  Skin: normal turgor, no rashes HEENT: Normocephalic, atraumatic. Pupils round and reactive; sclera anicteric;no lid lag,  Nose without nasal septal hypertrophy Mouth/Parynx benign; Mallinpatti scale 2 Neck: No JVD, no carotid bruits with normal carotid upstroke Lungs: clear to ausculatation and percussion; no wheezing or rales Heart: RRR, s1 s2 normal; very faint 1/6 systolic murmur; no diastolic murmur. Abdomen: soft, nontender; no hepatosplenomehaly, BS+; abdominal aorta nontender and not dilated by palpation. Pulses 2+ Extremities: no clubbing cyanosis or edema, Homan's sign negative  Neurologic: grossly nonfocal Psychological: Normal affect and mood  ECG (independently read by me): Normal sinus rhythm at 74 bpm.  Normal intervals.  No ectopy.  January 2017 ECG (independently read by me): normal sinus rhythm at 74 bpm.  Normal intervals.  December 2015 ECG (and apparently read by me):  Normal sinus rhythm at 75 bpm.  No ectopy.  Normal intervals.  Prior June  2014 ECG: Normal sinus rhythm at 65 beats per minute   LABS: BMP Latest Ref Rng 08/21/2015 01/08/2015 08/19/2014  Glucose 65 - 99 mg/dL 94 94 101(H)  BUN 7 - 25 mg/dL '11 20 14  '$ Creatinine 0.50 - 0.99 mg/dL 0.99 0.94 0.94  Sodium 135 - 146 mmol/L 141 139 139  Potassium 3.5 - 5.3 mmol/L 4.5 4.1 4.4  Chloride 98 - 110 mmol/L 106 104 106  CO2 20 - 31 mmol/L '26 27 28  '$ Calcium 8.6 - 10.4 mg/dL 9.0 9.4 9.4   Hepatic Function Latest Ref Rng 08/21/2015 08/19/2014 07/03/2013  Total Protein 6.1 - 8.1 g/dL 6.5 6.9 6.5  Albumin 3.6 - 5.1 g/dL 4.1 4.1 4.0  AST 10 - 35 U/L '23 23 25  '$ ALT 6 - 29 U/L '18 18 20  '$ Alk Phosphatase 33 - 130 U/L 68 52 45  Total Bilirubin 0.2 - 1.2 mg/dL 0.6 0.4 0.5   CBC Latest Ref Rng 08/21/2015 08/19/2014 05/24/2012  WBC 4.0 - 10.5 K/uL 3.8(L) 4.1 5.6  Hemoglobin 12.0 - 15.0 g/dL 14.0 13.8 15.0  Hematocrit 36.0 - 46.0 % 42.1 39.6 45.2  Platelets 150 - 400 K/uL 262 227 248   Lab Results  Component Value Date   MCV 93.3 08/21/2015   MCV 89.2 08/19/2014   MCV 91.9 05/24/2012   Lab Results  Component Value Date   TSH 3.079 08/21/2015   Lab Results  Component Value Date   HGBA1C 5.8* 08/21/2015   Lipid Panel     Component Value Date/Time   CHOL 199 08/21/2015 0001   CHOL 222* 05/09/2013 1117   TRIG 91 08/21/2015 0001   TRIG 70 05/09/2013 1117   HDL 68 08/21/2015 0001   HDL 71 05/09/2013 1117   CHOLHDL 2.9 08/21/2015 0001   VLDL 18 08/21/2015 0001   LDLCALC 113 08/21/2015 0001   LDLCALC 137* 05/09/2013 1117     RADIOLOGY: No results found.    ASSESSMENT AND PLAN: Stacy Carrillo is a 65 year old WF who has a history of hyperlipidemia and has refused consideration to take statins but has been tolerating Zetia 10 mg.  Initially, she was very hesitant to initiate any therapy for hyperlipidemia but after nutritional guidance despite weight loss she ultimately consented to initiate therapy.  She has tolerated Zetia without side effects. She has normal systolic  function and are very faint systolic murmur most likely is due to trivial tricuspid regurgitation which is not significant.  She had recently developed some episodes of short chest fluttering.  These have improved with initiation of low-dose Toprol-XL at 12.5 mg but she continues to note a rare occurrence.  I did suggest that she can increase this to 25 mg daily as needed.  Her weight has increased to 180 pounds, which is a 4 pound increase from January 2017.  She has regained a lot of the weight which she had lost several years ago.  We discussed increased exercise.  She plans to possibly go back to the nutritionist which she had seen in the past for further guidance.  She has a history of mild hypothyroidism and is on very low-dose levothyroxine at 25 g.  There is a history of restless legs.  As long as she remains stable, I will see her in one year for reevaluation or sooner if problems arise.   Time spent: 25 minutes  Troy Sine, MD, [facility]  03/28/2016 7:11 PM

## 2016-03-28 NOTE — Patient Instructions (Signed)
Your physician wants you to follow-up in: 1 year or sooner if needed. You will receive a reminder letter in the mail two months in advance. If you don't receive a letter, please call our office to schedule the follow-up appointment.   If you need a refill on your cardiac medications before your next appointment, please call your pharmacy.   

## 2016-07-04 ENCOUNTER — Encounter: Payer: Self-pay | Admitting: Family Medicine

## 2016-07-04 ENCOUNTER — Ambulatory Visit (INDEPENDENT_AMBULATORY_CARE_PROVIDER_SITE_OTHER): Payer: BLUE CROSS/BLUE SHIELD | Admitting: Family Medicine

## 2016-07-04 ENCOUNTER — Ambulatory Visit: Payer: Self-pay | Admitting: Family Medicine

## 2016-07-04 VITALS — BP 126/70 | HR 72 | Temp 97.7°F | Ht 66.0 in | Wt 178.0 lb

## 2016-07-04 DIAGNOSIS — M25511 Pain in right shoulder: Secondary | ICD-10-CM | POA: Diagnosis not present

## 2016-07-04 DIAGNOSIS — R109 Unspecified abdominal pain: Secondary | ICD-10-CM | POA: Diagnosis not present

## 2016-07-04 LAB — POCT URINALYSIS DIPSTICK
Bilirubin, UA: NEGATIVE
Glucose, UA: NEGATIVE
Ketones, UA: NEGATIVE
Leukocytes, UA: NEGATIVE
Nitrite, UA: NEGATIVE
PROTEIN UA: NEGATIVE
RBC UA: NEGATIVE
SPEC GRAV UA: 1.025
UROBILINOGEN UA: NEGATIVE
pH, UA: 5.5

## 2016-07-04 MED ORDER — CELECOXIB 200 MG PO CAPS: ORAL_CAPSULE | ORAL | 0 refills | Status: DC

## 2016-07-04 NOTE — Progress Notes (Signed)
Chief Complaint  Patient presents with  . Flank Pain    right sided soreness on her right side that she noticed last week.   . Shoulder Pain    right shoulder pain x 6 months.    Soreness started about a week ago at her right rib/flank area. She thought it could have been from doing pilates.  Started as soreness when she raised her right arm on Monday. By Thursday, pain got worse--leaning to pick something up, or any movement.  Sometimes feels a pinching/poking sensation.  It feels like it starts in the back, radiates to the front. She tried heat and ice.  Heat temporary helps, ice didn't help.  Hasn't tried any pain medications.  Pain is slightly better today.  Denies any relation to eating, no nausea, vomiting, bowel changes. No urinary symptoms, fever, chills.  Sees chiro q2 weeks, has appointment tomorrow.  Also complaining of right shoulder pain x 6 months, especially noticeable with pilates.  Massages help.  Pain with holding her arms out (abduction), any overhead movement, reports "sore all the time".   Doesn't take NSAIDs as they bother her stomach, very sensitive.  PMH, PSH, SH reviewed  Outpatient Encounter Prescriptions as of 07/04/2016  Medication Sig Note  . desvenlafaxine (PRISTIQ) 50 MG 24 hr tablet Take 50 mg by mouth daily.     Marland Kitchen ezetimibe (ZETIA) 10 MG tablet Take 1 tablet (10 mg total) by mouth daily.   Marland Kitchen L-Methylfolate (DEPLIN PO) Take by mouth.   . levothyroxine (SYNTHROID, LEVOTHROID) 25 MCG tablet TAKE 1 TABLET (25 MCG TOTAL) BY MOUTH DAILY.   Marland Kitchen LORazepam (ATIVAN) 0.5 MG tablet Take 0.5 mg by mouth as needed.   11/14/2013: Uses once a week or less  . metoprolol succinate (TOPROL XL) 25 MG 24 hr tablet TAKE 1/2 TABLET FOR 2 WEEKS THEN INCREASE TO 1 TABLET DAILY   . rOPINIRole (REQUIP) 0.25 MG tablet Take 0.25 mg by mouth 3 (three) times a week. Take 1 tab by mouth 1-2 hours before bed time, may increase to 5 tabs daily   . fluticasone (FLONASE) 50 MCG/ACT nasal spray  Place 1 spray into both nostrils daily. 08/25/2015: Hasn't needed yet this year  . ranitidine (ZANTAC) 150 MG tablet Take 150 mg by mouth as needed.   08/25/2015: Hasn't been needing  . [DISCONTINUED] b complex vitamins tablet Take 1 tablet by mouth daily.   . [DISCONTINUED] Cholecalciferol (VITAMIN D) 2000 UNITS tablet Take 2,000 Units by mouth daily.     . [DISCONTINUED] L-Methylfolate-Algae 7.5-90.314 MG CAPS Take 1 capsule by mouth daily. Take 1 cap daily 12/28/2015: Received from: [place]: Take by mouth.  . [DISCONTINUED] Magnesium 250 MG TABS Take 1 tablet by mouth daily.     . [DISCONTINUED] ondansetron (ZOFRAN-ODT) 4 MG disintegrating tablet Take 1 tablet by mouth once. Use for colon prep 12/28/2015: Received from: [place]: Take by mouth.   No facility-administered encounter medications on file as of 07/04/2016.    Allergies  Allergen Reactions  . Zithromax [Azithromycin] Hives    ROS: Denies fevers, chills, cough, shortness of breath, nausea, vomiting, abdominal pain, change in bowels.  No urinary complaints, bleeding, bruising, rash, numbness, tingling, weakness.  PHYSICAL EXAM:  BP 126/70   Pulse 72   Temp 97.7 F (36.5 C) (Tympanic)   Ht 5\' 6"  (1.676 m)   Wt 178 lb (80.7 kg)   BMI 28.73 kg/m   Well appearing, pleasant  female in no distress HEENT: PERRL, EOMI, conjunctiva and sclera are clear.  Neck: no lymphadenopathy, thyromegaly or mass Heart: regular rate and rhythm without murmur Lungs: clear bilaterally Back: no spinal tenderness or CVA tenderness Abdomen: soft, mildly tender at RUQ. No hepatosplenomegaly or mass. Extremities: no edema.  She has discomfort with palpation of right anterior shoulder. Some pain with internal rotation of the right shoulder and with subscapularis testing, but strength was normal. No pain or weakness with other RC testing. Neuro: normal strength, sensation Skin: normal  turgor, no lesions Psych: normal mood, affect, hygiene and grooming  Urine dip normal  ASSESSMENT/PLAN:  Right shoulder pain - suspect tendonitis, no tear. NSAID, rest, f/u as scheduled with chiro. consider ortho if persists/worsens - Plan: celecoxib (CELEBREX) 200 MG capsule  Flank pain - suspect MSK etiology (strain or spasm). NSAID, heat, stretches. risks/ of celebrex reviewed.  - Plan: POCT Urinalysis Dipstick, celecoxib (CELEBREX) 200 MG capsule    Right side pain--given that it is positional, I suspect musculoskeletal etiology.  Right shoulder pain--I suspect tendonitis.  Recommend trial of NSAID. Intolerant of many due to GI side effects. Trial of Celebrex, and to restart PPI if any GI side effects. Consider following up with ortho for injection if not improving.  Review of chart shows normal hepatobiliary scan in 09/2012 and ultrasound of RUQ (cyst in liver noted, normal gallbladder).  Shown stretches; risks/side effects of meds reviewed.   2 separate problems addressed

## 2016-07-04 NOTE — Patient Instructions (Signed)
Right side pain--given that it is positional, I suspect musculoskeletal etiology. Continue with heat, do gentle stretches 2-3 times daily, and take the anti-inflammatories. Avoid activities which worsen the pain (ie pilates) for 1 week.  I doubt it is related to your liver or gall bladder (given the worsening with breathing, stretching, movement).  Your gall bladder and liver were evaluated in 09/2012 and were okay.  If ongoing/worsening pain, we can reconsider.   Right shoulder pain--I suspect tendonitis.  Recommend trial of anti-inflammatory.  Since you are intolerant of many of them due to GI side effects, retry Celebrex. Restart PPI (prilosec, nexium--the OTC versions are fine) or Zantac or Pepcid, if any GI side effects. Consider following up with orthopedist for injection if not improving.

## 2016-07-11 IMAGING — MG MM DIGITAL SCREENING BILAT W/ CAD
4 series · 4 of 4 positions shown · non-contrast
Comparison: Previous exam(s).

CLINICAL DATA: Screening.

EXAM:
DIGITAL SCREENING BILATERAL MAMMOGRAM WITH CAD

[R MLO]
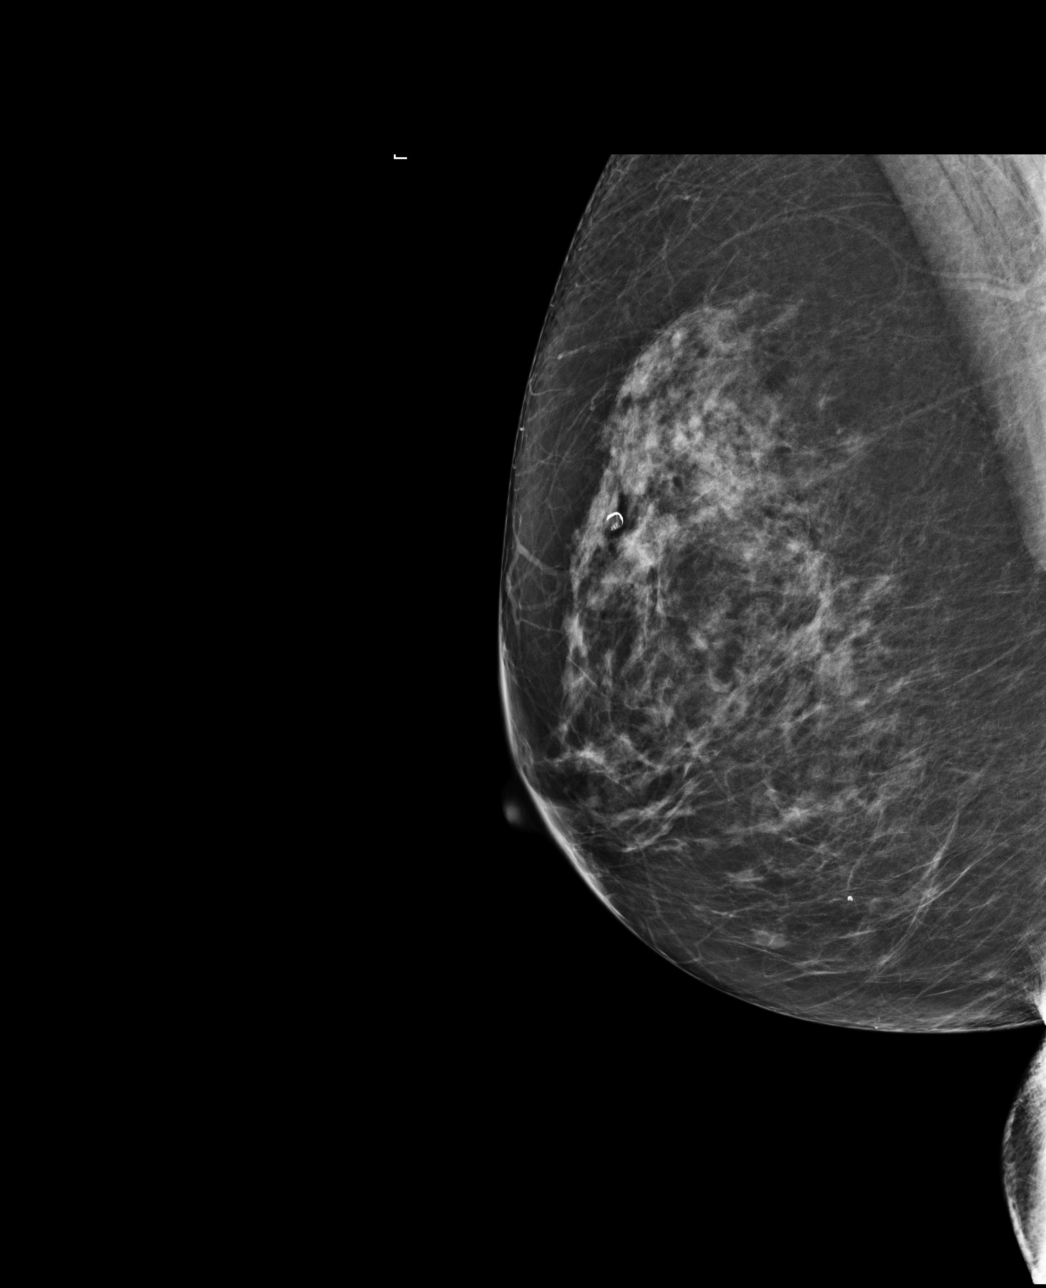

[L CC]
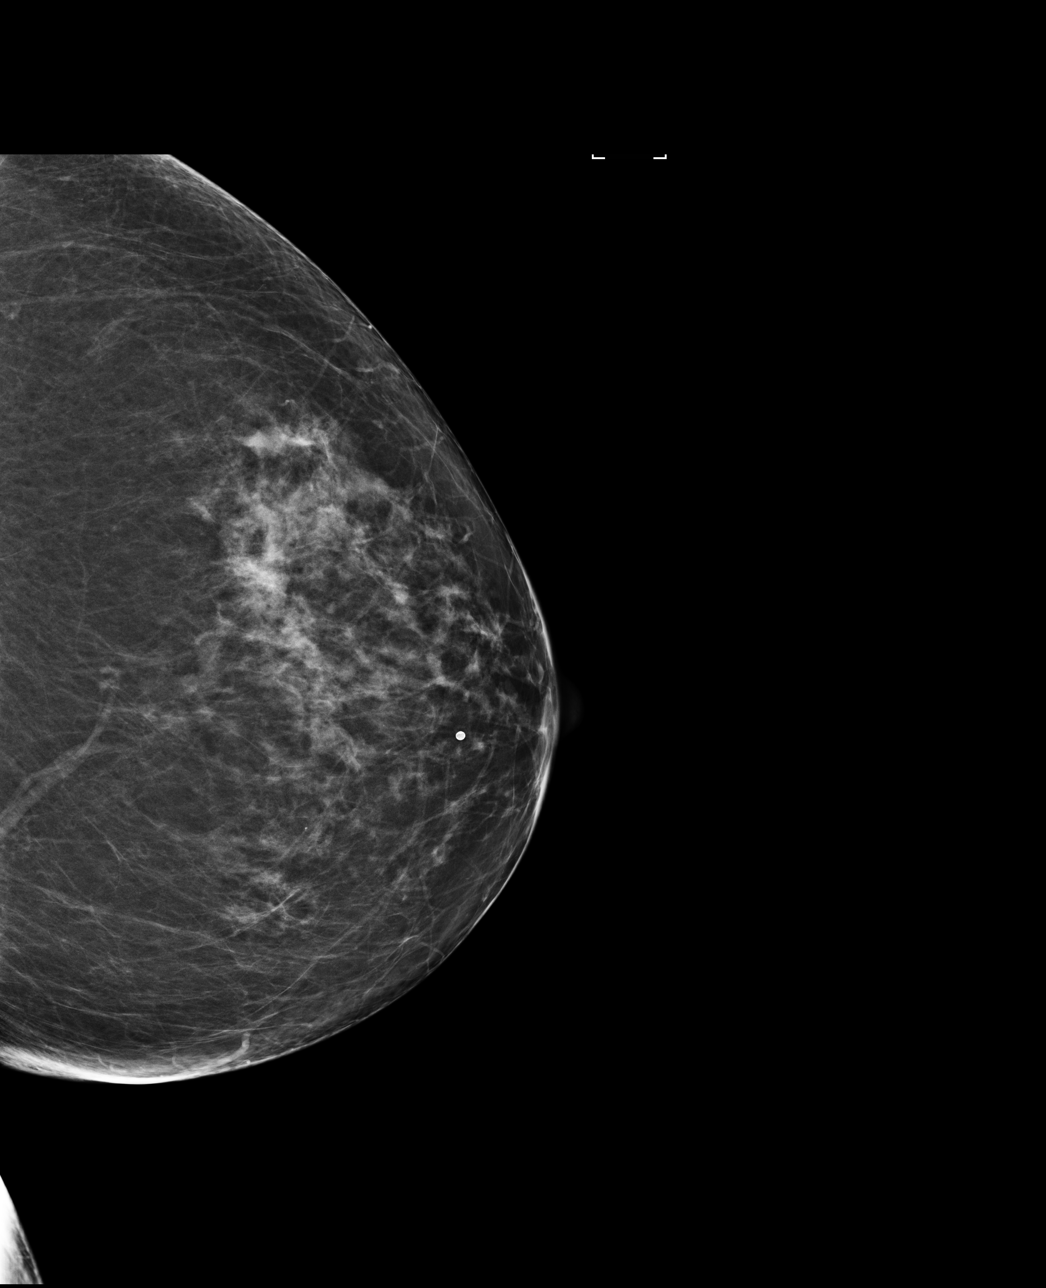

[R CC]
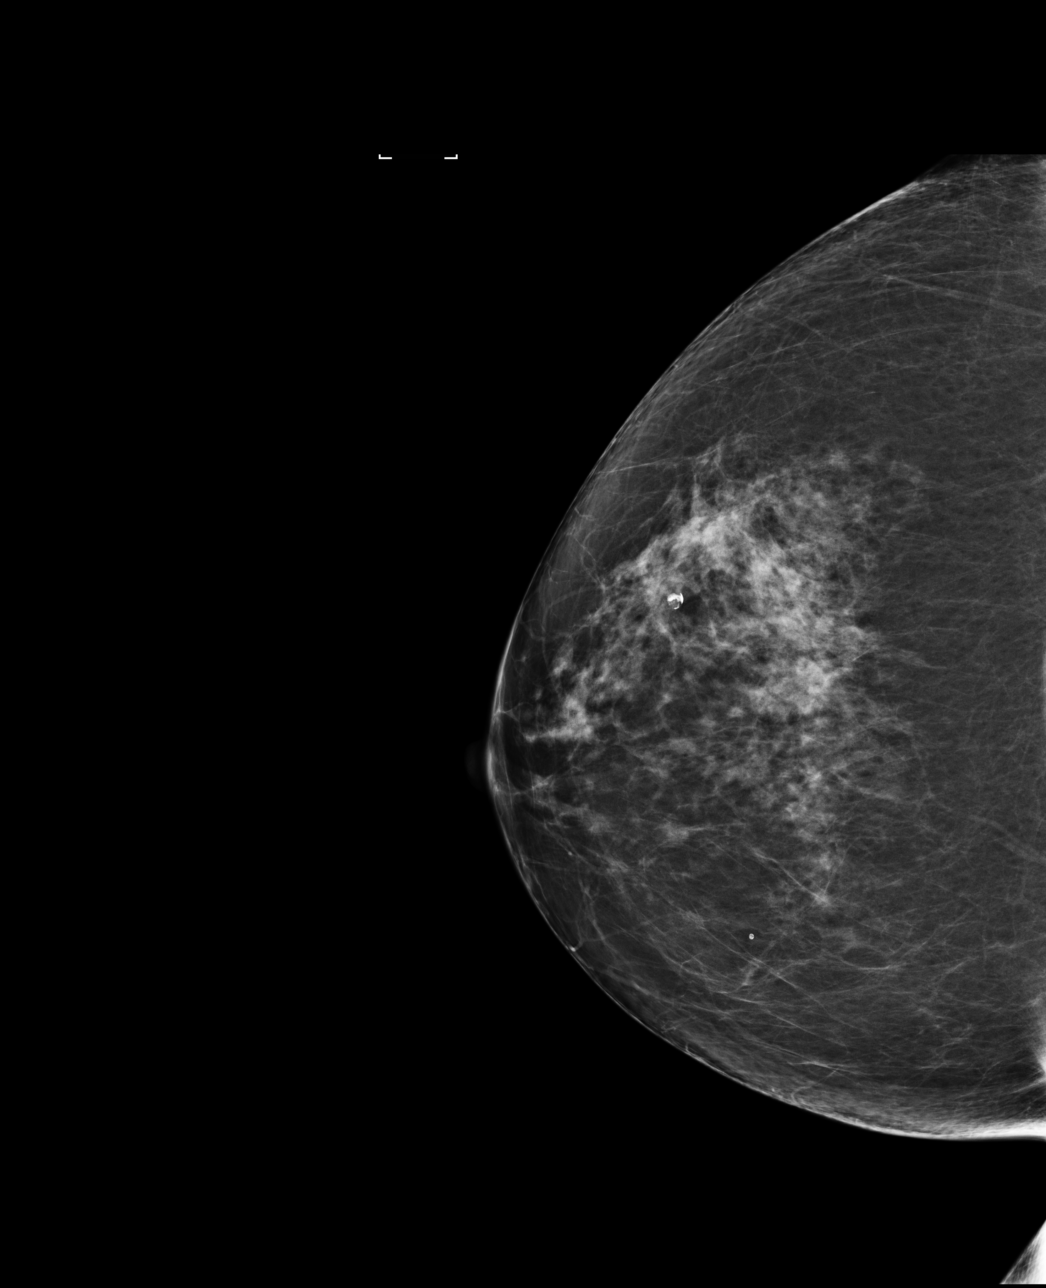

[L MLO]
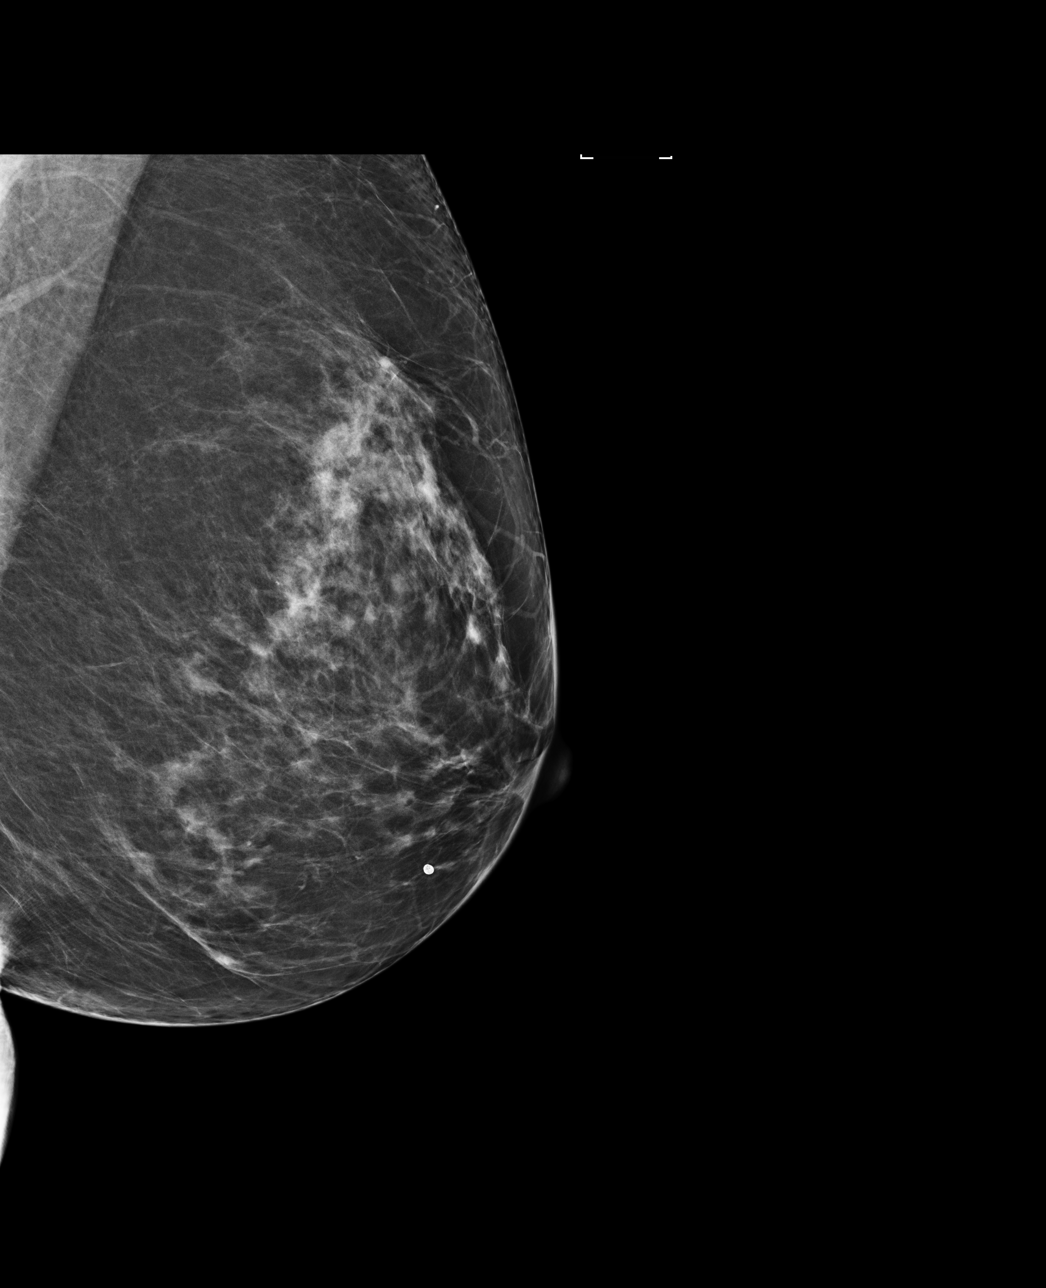

[4 of 4 positions shown; findings below may reference images not displayed]

ACR Breast Density Category b: There are scattered areas of
fibroglandular density.
FINDINGS: There are no findings suspicious for malignancy. Images were
processed with CAD.
IMPRESSION: No mammographic evidence of malignancy. A result letter of this
screening mammogram will be mailed directly to the patient.

RECOMMENDATION:
Screening mammogram in one year. (Code:AS-G-LCT)

BI-RADS CATEGORY  1: Negative.

## 2016-08-06 ENCOUNTER — Other Ambulatory Visit: Payer: Self-pay | Admitting: Family Medicine

## 2016-08-09 NOTE — Telephone Encounter (Signed)
Pt has an appt end of this month for cpe

## 2016-08-23 ENCOUNTER — Other Ambulatory Visit: Payer: No Typology Code available for payment source

## 2016-08-25 ENCOUNTER — Other Ambulatory Visit (INDEPENDENT_AMBULATORY_CARE_PROVIDER_SITE_OTHER): Payer: Medicare Other

## 2016-08-25 ENCOUNTER — Encounter: Payer: No Typology Code available for payment source | Admitting: Family Medicine

## 2016-08-25 DIAGNOSIS — Z1159 Encounter for screening for other viral diseases: Secondary | ICD-10-CM

## 2016-08-25 DIAGNOSIS — E039 Hypothyroidism, unspecified: Secondary | ICD-10-CM

## 2016-08-25 DIAGNOSIS — E538 Deficiency of other specified B group vitamins: Secondary | ICD-10-CM

## 2016-08-25 DIAGNOSIS — R7301 Impaired fasting glucose: Secondary | ICD-10-CM | POA: Diagnosis not present

## 2016-08-25 DIAGNOSIS — R945 Abnormal results of liver function studies: Secondary | ICD-10-CM | POA: Diagnosis not present

## 2016-08-25 DIAGNOSIS — E7800 Pure hypercholesterolemia, unspecified: Secondary | ICD-10-CM | POA: Diagnosis not present

## 2016-08-25 LAB — COMPREHENSIVE METABOLIC PANEL
ALT: 19 U/L (ref 6–29)
AST: 23 U/L (ref 10–35)
Albumin: 4 g/dL (ref 3.6–5.1)
Alkaline Phosphatase: 47 U/L (ref 33–130)
BUN: 12 mg/dL (ref 7–25)
CHLORIDE: 106 mmol/L (ref 98–110)
CO2: 24 mmol/L (ref 20–31)
Calcium: 9.1 mg/dL (ref 8.6–10.4)
Creat: 0.99 mg/dL (ref 0.50–0.99)
GLUCOSE: 103 mg/dL — AB (ref 65–99)
POTASSIUM: 4.6 mmol/L (ref 3.5–5.3)
Sodium: 140 mmol/L (ref 135–146)
Total Bilirubin: 0.6 mg/dL (ref 0.2–1.2)
Total Protein: 6.7 g/dL (ref 6.1–8.1)

## 2016-08-25 LAB — LIPID PANEL
CHOL/HDL RATIO: 3.6 ratio (ref <=5.0)
Cholesterol: 222 mg/dL — ABNORMAL HIGH (ref 125–200)
HDL: 62 mg/dL (ref >=46)
LDL CALC: 140 mg/dL — AB (ref <130)
Triglycerides: 99 mg/dL (ref <150)
VLDL: 20 mg/dL (ref <30)

## 2016-08-25 LAB — CBC WITH DIFFERENTIAL/PLATELET
BASOS ABS: 46 {cells}/uL (ref 0–200)
BASOS PCT: 1 %
EOS ABS: 184 {cells}/uL (ref 15–500)
Eosinophils Relative: 4 %
HEMATOCRIT: 42.1 % (ref 35.0–45.0)
Hemoglobin: 14 g/dL (ref 11.7–15.5)
LYMPHS PCT: 37 %
Lymphs Abs: 1702 cells/uL (ref 850–3900)
MCH: 30.6 pg (ref 27.0–33.0)
MCHC: 33.3 g/dL (ref 32.0–36.0)
MCV: 92.1 fL (ref 80.0–100.0)
MONO ABS: 506 {cells}/uL (ref 200–950)
MPV: 9.6 fL (ref 7.5–12.5)
Monocytes Relative: 11 %
Neutro Abs: 2162 cells/uL (ref 1500–7800)
Neutrophils Relative %: 47 %
Platelets: 258 10*3/uL (ref 140–400)
RBC: 4.57 MIL/uL (ref 3.80–5.10)
RDW: 13.1 % (ref 11.0–15.0)
WBC: 4.6 10*3/uL (ref 4.0–10.5)

## 2016-08-25 LAB — TSH: TSH: 2.52 m[IU]/L

## 2016-08-26 LAB — HEPATITIS C ANTIBODY: HCV Ab: REACTIVE — AB

## 2016-08-26 LAB — HEMOGLOBIN A1C
Hgb A1c MFr Bld: 5.4 % (ref <5.7)
Mean Plasma Glucose: 108 mg/dL

## 2016-08-26 LAB — VITAMIN B12: Vitamin B-12: 621 pg/mL (ref 200–1100)

## 2016-08-28 NOTE — Progress Notes (Signed)
Chief Complaint  Patient presents with  . Medicare Wellness    Welcome to Medicare visit with pelvic, labs already done.  No concerns.    Stacy Carrillo is a 65 y.o. female who presents for Welcome to Medicare Visit, and follow-up on chronic medical conditions.  She has the following concerns:  She has some ongoing right shoulder pain. She is planning to go back to [facility] for eval. Still has some intermittent pain in her right lower back, but much improved.  Atrophic vaginitis:She has some vaginal dryness, only slight discomfort with intercourse (Astroglide helps). She has a mild hot flash once daily, lasts 5 minutes. Never used the premarin and isn't interested in this.  Hyperlipidemia: Treated by Dr. Claiborne Billings. She is on Zetia without side effects. She had myalgias on statins in the past. She had labs done prior to her visit--see below.  He started her on toprol XL in January for palpitations--still taking just 1/2 tablet daily with good results. Denies palpitations, dizziness.  Hypothyroidism: On generic thyroid medication. Taking it on an empty stomach, separate from other medications and vitamins. Denies any missed doses. She has noticed a little more hair loss than usual in the last 3 months, seems to occur more typically this time of year. No changes in bowels, nails, energy, moods.  Seeing psychiatrist--doing well, stable on medications. She continues to see psychiatrist--switched to Dr. Caprice Beaver after her psychiatrist (Dr. Bridgett Larsson) left the practice.  She was seeing him monthly.  Dr. Caprice Beaver only sees her every 3 months, and for a much shorter visit (20 min vs 60), doesn't feel like she is getting the same needs med.  She has discussed her insomnia with her, but is still ongoing.  Reports that she has tried many many medications with Dr. Bridgett Larsson in the past. She doesn't sleep well at night, so finds herself eating a lot--unhealthy foods--in the evenings (a lot of chocolate)  whereas she can eat very healthy during the day. She has gained 10#.  She has h/o moderate OSA (2007 per chart). She was on CPAP for about a month, didn't like it.She had a sleep study 11/2015 which was normal.   Immunization History  Administered Date(s) Administered  . Influenza Split 11/26/2012  . Influenza, High Dose Seasonal PF 08/29/2016  . Influenza,inj,Quad PF,36+ Mos 11/14/2013, 08/14/2014, 08/25/2015  . Tdap 07/04/2013  . Zoster 07/04/2013   Last Pap smear: s/p hysterectomy Last mammogram: 09/2014 (in [place]) Last colonoscopy: 01/2016 Dr. Vira Agar in [place] adenoma and internal hemorrhoids Last DEXA: over 8 years ago, ?slight abnormal, through GYN in [place]. Recommended and ordered for the last 2 years, but she never scheduled it. Dentist: twice yearly  Ophtho: yearly  Exercise: Walking, swimming, pilates, stationary bike--totals 45 minutes 5x/week.  Uses equipment with pilates, so has some weight-bearing (resistance).  Other doctors caring for patient include: Dentist: Dr. Orlando Penner Ophtho: Dr. Satira Sark Cardiologist: Dr. Claiborne Billings ENT: Dr. Benjamine Mola Psych: Dr. Caprice Beaver GI: Dr. Gaylyn Cheers [facility]) Derm: Dr. Nehemiah Massed [facility]) Chiro: Dr. Claude Manges  Depression screen: negative Fall screen: negative Functional Status Survey: notable for hearing loss See full questionnaires in epic  End of Life Discussion:  Patient has a living will and medical power of attorney  Past Medical History:  Diagnosis Date  . Anxiety    GENERALIZED/WILLIAM CHEN MD  . Colon polyps   . Depression panic(dr.william chen)  . Elevated cholesterol   . GERD (gastroesophageal reflux disease) HH per pt EGD nl 2/06  . Hypothyroid   .  IBS (irritable bowel syndrome) constipation(dr elliot Doniphan)  . OSA (obstructive sleep apnea) moderate, not using CPAP, aware of risks   2007; sleep study 11/2015 NORMAL  . Osteopenia hx  . RLS (restless legs syndrome)   . Vitamin  D deficiency     Past Surgical History:  Procedure Laterality Date  . ABDOMINAL HYSTERECTOMY  1999   and BSO (for benign reasons)  . APPENDECTOMY  1999  . COLONOSCOPY  2011   Dr. Vira Agar [facility] [place])  . COLONOSCOPY WITH PROPOFOL N/A 01/20/2016   Procedure: COLONOSCOPY WITH PROPOFOL;  Surgeon: Manya Silvas, MD;  Location: [facility] ENDOSCOPY;  Service: Endoscopy;  Laterality: N/A;  . ESOPHAGOGASTRODUODENOSCOPY  2009  . TONSILLECTOMY      Social History   Social History  . Marital status: Married    Spouse name: N/A  . Number of children: 1  . Years of education: N/A   Occupational History  . homemaker    Social History Main Topics  . Smoking status: Never Smoker  . Smokeless tobacco: Never Used  . Alcohol use Yes     Comment: socially, 1 glass wine bi-weekly  . Drug use: No  . Sexual activity: Yes    Partners: Male   Other Topics Concern  . Not on file   Social History Narrative   Lives with husband.  Son lives in [place]    Family History  Problem Relation Age of Onset  . Heart disease Mother   . Cancer Father     stomach and liver  . Sarcoidosis Sister   . Heart disease Brother 43  . Depression Brother   . Breast cancer Cousin     50's  . Diabetes Other     gestational  . Heart disease Maternal Aunt   . Heart disease Paternal Aunt   . Heart disease Paternal Uncle   . Stroke Neg Hx   . Colon cancer Neg Hx     Outpatient Encounter Prescriptions as of 08/29/2016  Medication Sig Note  . desvenlafaxine (PRISTIQ) 50 MG 24 hr tablet Take 50 mg by mouth daily.     Marland Kitchen ezetimibe (ZETIA) 10 MG tablet Take 1 tablet (10 mg total) by mouth daily.   . fluticasone (FLONASE) 50 MCG/ACT nasal spray Place 1 spray into both nostrils daily. 08/25/2015: Hasn't needed yet this year  . L-Methylfolate (DEPLIN PO) Take by mouth.   . levothyroxine (SYNTHROID, LEVOTHROID) 25 MCG tablet TAKE 1 TABLET (25 MCG TOTAL) BY MOUTH DAILY.   Marland Kitchen LORazepam (ATIVAN) 0.5 MG  tablet Take 0.5 mg by mouth as needed.   11/14/2013: Uses once a week or less  . metoprolol succinate (TOPROL XL) 25 MG 24 hr tablet TAKE 1/2 TABLET FOR 2 WEEKS THEN INCREASE TO 1 TABLET DAILY   . Multiple Vitamin (MULTIVITAMIN) tablet Take 1 tablet by mouth daily. 08/29/2016: Patient has with her today  . ranitidine (ZANTAC) 150 MG tablet Take 150 mg by mouth as needed.   08/25/2015: Hasn't been needing  . rOPINIRole (REQUIP) 0.25 MG tablet Take 0.25 mg by mouth 3 (three) times a week. Take 1 tab by mouth 1-2 hours before bed time, may increase to 5 tabs daily   . [DISCONTINUED] celecoxib (CELEBREX) 200 MG capsule Take one tablet daily with food for up to 2 weeks. You may increase to twice daily if needed    No facility-administered encounter medications on file as of 08/29/2016.    Vitamins have total of 5000 IU of Vitamin  D (2500 in am pack and 2500 in pm pack), also has fish oils  Allergies  Allergen Reactions  . Zithromax [Azithromycin] Hives    ROS: The patient denies anorexia, fever, headaches (rare migraines, had aura last week, but no headache; last migraine 2 months ago, much better); denies vision changes, ear pain, sore throat, breast concerns, chest pain, palpitations (resolved with toprol), dizziness, syncope, dyspnea on exertion, cough, nausea, vomiting, diarrhea, constipation, abdominal pain, melena, hematochezia, hematuria, incontinence, vaginal bleeding, discharge, odor or itch, genital lesions, joint pains, numbness, tingling, weakness, tremor, suspicious skin lesions, abnormal bleeding/bruising, or enlarged lymph nodes.  Occasional knee pain, intermittent.  Some mild ongoing LBP (psoas and gluteus minimus)--overall much improved. Right shoulder pain Some hearing loss and chronic tinnitus (bilateral), unchanged. Sees Dr. Benjamine Mola again in October. Vaginal dryness, unchanged, see HPI. Some ankle pain when in [place], walking in the heat. Insomnia as per HPI 10# weight gain in the  last year   PHYSICAL EXAM:  BP 122/76 (BP Location: Left Arm, Patient Position: Sitting, Cuff Size: Normal)   Pulse 76   Ht 5\' 6"  (1.676 m)   Wt 181 lb 6.4 oz (82.3 kg)   BMI 29.28 kg/m   General Appearance:  Alert, cooperative, no distress, appears stated age   Head:  Normocephalic, without obvious abnormality, atraumatic   Eyes:  PERRL, conjunctiva/corneas clear, EOM's intact, fundi benign   Ears:  Normal TM's and external ear canals  Nose:  Nares normal, mucosa normal, no drainage or sinus tenderness   Throat:  Lips, mucosa, and tongue normal; teeth and gums normal   Neck:  Supple, no lymphadenopathy; thyroid: no enlargement/tenderness/nodules; no carotid  bruit or JVD   Back:  Spine nontender, no curvature, ROM normal, no CVA tenderness.  Lungs:  Clear to auscultation bilaterally without wheezes, rales or ronchi; respirations unlabored   Chest Wall:  No tenderness or deformity   Heart:  Regular rate and rhythm, S1 and S2 normal, no murmur, rub or gallop   Breast Exam:  No nipple inversion, nipple discharge. No dominant masses or axillary lymphadenopathy.   Abdomen:  Soft, non-tender, nondistended, normoactive bowel sounds, no masses, no hepatosplenomegaly   Genitalia:  normal external genitalia, with mild atrophic changes. Bimanual exam is normal--no appreciable masses. Uterus and ovaries surgically absent.    Rectal exam--deferred (had colonoscopy earlier this year)  Extremities:  No clubbing, cyanosis or edema   Pulses:  2+ and symmetric all extremities   Skin:  Skin color, texture, turgor normal, no rashes. 3-70mm red nodule below the right knee (present x long time per pt, derm has seen; she periodically shaves it off and it bleeds)  Lymph nodes:  Cervical, supraclavicular, and axillary nodes normal   Neurologic:  CNII-XII intact, normal strength, sensation and gait; reflexes 2+ and symmetric throughout   Psych:  Normal mood,  affect, hygiene and grooming  Recent labs:  Hepatitis C Ab+ (reactive), but negative RNA quant (she reports getting similar report when she was much younger as well)  Lab Results  Component Value Date   HGBA1C 5.4 08/25/2016   Lab Results  Component Value Date   WBC 4.6 08/25/2016   HGB 14.0 08/25/2016   HCT 42.1 08/25/2016   MCV 92.1 08/25/2016   PLT 258 08/25/2016   Lab Results  Component Value Date   CHOL 222 (H) 08/25/2016   HDL 62 08/25/2016   LDLCALC 140 (H) 08/25/2016   TRIG 99 08/25/2016   CHOLHDL 3.6 08/25/2016   Lab  Results  Component Value Date   TSH 2.52 08/25/2016     Chemistry      Component Value Date/Time   NA 140 08/25/2016 1037   K 4.6 08/25/2016 1037   CL 106 08/25/2016 1037   CO2 24 08/25/2016 1037   BUN 12 08/25/2016 1037   CREATININE 0.99 08/25/2016 1037      Component Value Date/Time   CALCIUM 9.1 08/25/2016 1037   ALKPHOS 47 08/25/2016 1037   AST 23 08/25/2016 1037   ALT 19 08/25/2016 1037   BILITOT 0.6 08/25/2016 1037     Fasting glucose 103 B12 level normal at 621   ASSESSMENT/PLAN:  Welcome to Medicare preventive visit  Impaired fasting glucose - A1c improved; encouraged weight loss, limiting chocolate/sweets - Plan: Hemoglobin A1c  Pure hypercholesterolemia - higher than previously, on the Zetia.  Will forward to her cardiologist - Plan: Lipid panel  Hypothyroidism, unspecified hypothyroidism type - adequately replaced with current dose, continue. discussed brand vs generic; contact us if it changes - Plan: DG Bone Density, TSH  Need for prophylactic vaccination and inoculation against influenza - Plan: Flu vaccine HIGH DOSE PF (Fluzone High dose)  Postmenopausal estrogen deficiency - minimal symptoms, just some vaginal dryness; doing well with lubricant. DEXA due - Plan: DG Bone Density  Need for pneumococcal vaccine - Plan: Pneumococcal conjugate vaccine 13-valent  Medication monitoring encounter - Plan: TSH, Lipid  panel, Comprehensive metabolic panel, CBC with Differential/Platelet  Insomnia  Anxiety state    Lipids significantly higher than last year--will forward to her cardiologist Weight gain--Counseled re: late night eating, keeping hands busy, staying upstairs away from the temptations. Discussed relaxing activities (adult coloring books, knitting, etc) Discussed psychiatrist (and med checks vs psychoanalysis) vs counseling with therapist--consider getting therapist, vs changing psych and expressing what she wants--apparently Dr. Caprice Beaver doesn't accept Medicare    Discussed monthly self breast exams and yearly mammograms (she is past due and reminded to schedule); at least 30 minutes of aerobic activity at least 5 days/week and weight-bearing exercise 2x/week; proper sunscreen use reviewed; healthy diet, including goals of calcium and vitamin D intake and alcohol recommendations (less than or equal to 1 drink/day) reviewed; regular seatbelt use; changing batteries in smoke detectors.  Immunization recommendations discussed--prevnar-13 and high dose flu shot given today..  Colonoscopy recommendations reviewed--due in 01/2021 (5 years). F/u DEXA again recommended, and again re-ordered. Pt advised to call the [facility] Center to schedule this.  Full Code, Full Care discussed. Will get [place] copies of her LV, HCPOA  F/u 1 year, fasting labs prior.  Medicare Attestation I have personally reviewed: The patient's medical and social history Their use of alcohol, tobacco or illicit drugs Their current medications and supplements The patient's functional ability including ADLs,fall risks, home safety risks, cognitive, and hearing and visual impairment Diet and physical activities Evidence for depression or mood disorders  The patient's weight, height, and BMI have been recorded in the chart.  I have made referrals, counseling, and provided education to the patient based on review of the above and I have  provided the patient with a written personalized care plan for preventive services.     Raford Brissett A, MD   08/29/2016

## 2016-08-29 ENCOUNTER — Telehealth: Payer: Self-pay

## 2016-08-29 ENCOUNTER — Ambulatory Visit (INDEPENDENT_AMBULATORY_CARE_PROVIDER_SITE_OTHER): Payer: Medicare Other | Admitting: Family Medicine

## 2016-08-29 VITALS — BP 122/76 | HR 76 | Ht 66.0 in | Wt 181.4 lb

## 2016-08-29 DIAGNOSIS — R7301 Impaired fasting glucose: Secondary | ICD-10-CM | POA: Diagnosis not present

## 2016-08-29 DIAGNOSIS — E7800 Pure hypercholesterolemia, unspecified: Secondary | ICD-10-CM | POA: Diagnosis not present

## 2016-08-29 DIAGNOSIS — Z5181 Encounter for therapeutic drug level monitoring: Secondary | ICD-10-CM

## 2016-08-29 DIAGNOSIS — F411 Generalized anxiety disorder: Secondary | ICD-10-CM | POA: Diagnosis not present

## 2016-08-29 DIAGNOSIS — E039 Hypothyroidism, unspecified: Secondary | ICD-10-CM | POA: Diagnosis not present

## 2016-08-29 DIAGNOSIS — Z23 Encounter for immunization: Secondary | ICD-10-CM

## 2016-08-29 DIAGNOSIS — Z780 Asymptomatic menopausal state: Secondary | ICD-10-CM

## 2016-08-29 DIAGNOSIS — Z0000 Encounter for general adult medical examination without abnormal findings: Secondary | ICD-10-CM | POA: Diagnosis not present

## 2016-08-29 DIAGNOSIS — G4700 Insomnia, unspecified: Secondary | ICD-10-CM | POA: Diagnosis not present

## 2016-08-29 LAB — HEPATITIS C RNA QUANTITATIVE: HCV Quantitative: NOT DETECTED IU/mL (ref <15)

## 2016-08-29 NOTE — Telephone Encounter (Signed)
Pt needs labs entered for CPE next year.   Thank you, Wells Guiles

## 2016-08-29 NOTE — Patient Instructions (Addendum)
HEALTH MAINTENANCE RECOMMENDATIONS:  It is recommended that you get at least 30 minutes of aerobic exercise at least 5 days/week (for weight loss, you may need as much as 60-90 minutes). This can be any activity that gets your heart rate up. This can be divided in 10-15 minute intervals if needed, but try and build up your endurance at least once a week.  Weight bearing exercise is also recommended twice weekly.  Eat a healthy diet with lots of vegetables, fruits and fiber.  "Colorful" foods have a lot of vitamins (ie green vegetables, tomatoes, red peppers, etc).  Limit sweet tea, regular sodas and alcoholic beverages, all of which has a lot of calories and sugar.  Up to 1 alcoholic drink daily may be beneficial for women (unless trying to lose weight, watch sugars).  Drink a lot of water.  Calcium recommendations are 1200-1500 mg daily (1500 mg for postmenopausal women or women without ovaries), and vitamin D 1000 IU daily.  This should be obtained from diet and/or supplements (vitamins), and calcium should not be taken all at once, but in divided doses.  Monthly self breast exams and yearly mammograms for women over the age of 78 is recommended.  Sunscreen of at least SPF 30 should be used on all sun-exposed parts of the skin when outside between the hours of 10 am and 4 pm (not just when at beach or pool, but even with exercise, golf, tennis, and yard work!)  Use a sunscreen that says "broad spectrum" so it covers both UVA and UVB rays, and make sure to reapply every 1-2 hours.  Remember to change the batteries in your smoke detectors when changing your clock times in the spring and fall.  Use your seat belt every time you are in a car, and please drive safely and not be distracted with cell phones and texting while driving.   Stacy Carrillo , Thank you for taking time to come for your Welcome to Medicare visit.  I appreciate your ongoing commitment to your health goals. Please review the  following plan we discussed and let me know if I can assist you in the future.   These are the goals we discussed: Goals    None      This is a list of the screening recommended for you and due dates:  Health Maintenance  Topic Date Due  . HIV Screening  08/22/1966  . Pap Smear  08/22/1972  . Flu Shot  07/05/2016  . DEXA scan (bone density measurement)  08/22/2016  . Pneumonia vaccines (1 of 2 - PCV13) 08/22/2016  . Mammogram  09/22/2016  . Tetanus Vaccine  07/05/2023  . Colon Cancer Screening  01/19/2026  . Shingles Vaccine  Completed  .  Hepatitis C: One time screening is recommended by Center for Disease Control  (CDC) for  adults born from [birth year] through 1965.   Completed   Pap smears are no longer needed HIV has previously been screened by other providers. Flu shot was given today. Stacy Carrillo was given today (and you will get pneumovax next year) You are due to mammogram (if on a yearly screening, was due 09/2015)--please call to schedule. Please call the Breast Center here in [place] to schedule your Bone Density test. I believe that the above date for colon cancer screening is incorrect.  Based on the pathology from your 2017 colonoscopy that you will be due in 5 years, not 10.    Please drop off a copy of your  living will and healthcare power of attorney at your convenience.

## 2016-08-30 ENCOUNTER — Encounter: Payer: Self-pay | Admitting: Family Medicine

## 2016-09-22 ENCOUNTER — Other Ambulatory Visit: Payer: Self-pay | Admitting: Family Medicine

## 2016-09-22 DIAGNOSIS — Z1231 Encounter for screening mammogram for malignant neoplasm of breast: Secondary | ICD-10-CM

## 2016-10-06 DIAGNOSIS — L578 Other skin changes due to chronic exposure to nonionizing radiation: Secondary | ICD-10-CM | POA: Diagnosis not present

## 2016-10-06 DIAGNOSIS — L570 Actinic keratosis: Secondary | ICD-10-CM | POA: Diagnosis not present

## 2016-10-20 ENCOUNTER — Telehealth: Payer: Self-pay | Admitting: *Deleted

## 2016-10-20 NOTE — Telephone Encounter (Signed)
-----   Message from Troy Sine, MD sent at 10/16/2016  3:09 PM EST ----- Lipids elevated despite zetia; would change to statin; consider crestor 20 mg if able to tolerate

## 2016-10-20 NOTE — Telephone Encounter (Signed)
Left lab results and recommendations on VM. Also released into my chart.

## 2016-10-24 ENCOUNTER — Encounter: Payer: Self-pay | Admitting: Family Medicine

## 2016-10-24 ENCOUNTER — Ambulatory Visit (INDEPENDENT_AMBULATORY_CARE_PROVIDER_SITE_OTHER): Payer: Medicare Other | Admitting: Family Medicine

## 2016-10-24 VITALS — BP 110/72 | HR 80 | Temp 98.7°F | Ht 66.5 in | Wt 184.2 lb

## 2016-10-24 DIAGNOSIS — J302 Other seasonal allergic rhinitis: Secondary | ICD-10-CM

## 2016-10-24 DIAGNOSIS — J069 Acute upper respiratory infection, unspecified: Secondary | ICD-10-CM

## 2016-10-24 NOTE — Progress Notes (Signed)
Chief Complaint  Patient presents with  . Nasal Congestion    worked in yard Wednesday, reports left side of face hurts. reports "everything is closed up."   . Hoarse    last voice on Saturday.    4 days ago she started with head congestion; 2 nights ago she lost her voice, had sore throat.  She has L>R facial pressure (cheeks).  Nasal mucus was discolored some yesterday, not much drainage today.  Cough is nonproductive, dry/hacky.  Last night she got some phlegm up. She had some chills 2 nights ago, none since. Denies myalgias.  No sick contacts.  Voice is slightly better today than yesterday.  She started taking plain Mucinex yesterday, drinking hot tea. She used Flonase 4 and 5 days ago, not since.  She typically gets allergies this time of year.  She had been raking leaves 6 days ago. She had also been exposed to heavy cologne during a pilates class--previously was a trigger for allergies, hasn't recently been too bad, but symptoms started same day. Some watery eyes, not itchy.  PMH, [place], SH reviewed  Outpatient Encounter Prescriptions as of 10/24/2016  Medication Sig Note  . desvenlafaxine (PRISTIQ) 50 MG 24 hr tablet Take 50 mg by mouth daily.     Marland Kitchen ezetimibe (ZETIA) 10 MG tablet Take 1 tablet (10 mg total) by mouth daily.   . fluticasone (FLONASE) 50 MCG/ACT nasal spray Place 1 spray into both nostrils daily. 10/24/2016: Uses prn--used 2 days last week.  . Guaifenesin (MUCINEX MAXIMUM STRENGTH) 1200 MG TB12 Take 1 tablet by mouth 2 (two) times daily.   Marland Kitchen L-Methylfolate (DEPLIN PO) Take by mouth.   . levothyroxine (SYNTHROID, LEVOTHROID) 25 MCG tablet TAKE 1 TABLET (25 MCG TOTAL) BY MOUTH DAILY.   . metoprolol succinate (TOPROL XL) 25 MG 24 hr tablet TAKE 1/2 TABLET FOR 2 WEEKS THEN INCREASE TO 1 TABLET DAILY   . Multiple Vitamin (MULTIVITAMIN) tablet Take 1 tablet by mouth daily. 10/24/2016: Not in the last 2 days  . ranitidine (ZANTAC) 150 MG tablet Take 150 mg by mouth as  needed.   10/24/2016: Took some over the weekend  . rOPINIRole (REQUIP) 0.25 MG tablet Take 0.25 mg by mouth 3 (three) times a week. Take 1 tab by mouth 1-2 hours before bed time, may increase to 5 tabs daily   . LORazepam (ATIVAN) 0.5 MG tablet Take 0.5 mg by mouth as needed.   11/14/2013: Uses once a week or less   No facility-administered encounter medications on file as of 10/24/2016.    Allergies  Allergen Reactions  . Zithromax [Azithromycin] Hives   ROS: no fever, chills, headaches (just sinus), dizziness, chest pain, shortness of breath, nausea, vomiting, diarrhea, bleeding, bruising, rash or other concerns.  See HPI.  PHYSICAL EXAM:  BP 110/72   Pulse 80   Temp 98.7 F (37.1 C) (Oral)   Ht 5' 6.5" (1.689 m)   Wt 184 lb 3.2 oz (83.6 kg)   BMI 29.29 kg/m   Well appearing female with hoarse voice, in no distress HEENT: PERRL, EOMI, conjunctiva and sclera are clear.  Nasal mucosa is mildly edematous, with clear-white mucus noted. Sinuses are nontender. OP is clear. TM's and EAc's are normal Neck: no lymphadenopathy or mass Heart: regular rate and rhythm without murmur Lungs: clear bilaterally Skin: normal turgor, no rash Neuro: alert and oriented, cranial nerves intact, normal gait Psych: normal mood, affect, hygiene and grooming  ASSESSMENT/PLAN:  Acute upper respiratory infection  Acute  seasonal allergic rhinitis, unspecified trigger  Likely has both virus and seasonal allergies. Supportive measures reviewed, as well as s/sx bacterial infection.    Continue to drink plenty of water. Use Delsym syrup in addition to the Mucinex, if needed for cough. For allergies--it is best if Flonase is used daily (regularly), as opposed to the oral antihistamines (claritin/zyrtec/allegra) which can be used just as needed. Often times overlapping the two works the best, and helps while waiting for the Flonase to fully get in your system. Consider resuming netipot as needed to  help with sinus pressure/pain.  Use Afrin nasal decongestant prior to flying if you are having nasal congestion (to prevent the ear pain).

## 2016-10-24 NOTE — Patient Instructions (Signed)
   Continue to drink plenty of water. Use Delsym syrup in addition to the Mucinex, if needed for cough. For allergies--it is best if Flonase is used daily (regularly), as opposed to the oral antihistamines (claritin/zyrtec/allegra) which can be used just as needed. Often times overlapping the two works the best, and helps while waiting for the Flonase to fully get in your system. Consider resuming netipot as needed to help with sinus pressure/pain.  Use Afrin nasal decongestant prior to flying if you are having nasal congestion (to prevent the ear pain).

## 2016-11-01 ENCOUNTER — Ambulatory Visit: Payer: BLUE CROSS/BLUE SHIELD

## 2016-11-01 DIAGNOSIS — H838X3 Other specified diseases of inner ear, bilateral: Secondary | ICD-10-CM | POA: Diagnosis not present

## 2016-11-01 DIAGNOSIS — R05 Cough: Secondary | ICD-10-CM | POA: Diagnosis not present

## 2016-11-08 ENCOUNTER — Other Ambulatory Visit: Payer: Self-pay | Admitting: Cardiovascular Disease

## 2016-11-08 NOTE — Telephone Encounter (Signed)
Rx has been sent to the pharmacy electronically. ° °

## 2016-11-09 DIAGNOSIS — Z0279 Encounter for issue of other medical certificate: Secondary | ICD-10-CM

## 2016-11-13 ENCOUNTER — Other Ambulatory Visit: Payer: Self-pay | Admitting: Family Medicine

## 2016-11-16 DIAGNOSIS — I788 Other diseases of capillaries: Secondary | ICD-10-CM | POA: Diagnosis not present

## 2016-11-16 DIAGNOSIS — L820 Inflamed seborrheic keratosis: Secondary | ICD-10-CM | POA: Diagnosis not present

## 2016-11-16 DIAGNOSIS — D485 Neoplasm of uncertain behavior of skin: Secondary | ICD-10-CM | POA: Diagnosis not present

## 2016-11-16 DIAGNOSIS — L578 Other skin changes due to chronic exposure to nonionizing radiation: Secondary | ICD-10-CM | POA: Diagnosis not present

## 2016-11-16 DIAGNOSIS — L570 Actinic keratosis: Secondary | ICD-10-CM | POA: Diagnosis not present

## 2016-12-15 ENCOUNTER — Ambulatory Visit
Admission: RE | Admit: 2016-12-15 | Discharge: 2016-12-15 | Disposition: A | Discharge status: Home / Self Care | Payer: Medicare Other | Source: Ambulatory Visit | Attending: Family Medicine | Admitting: Family Medicine

## 2016-12-15 DIAGNOSIS — Z1231 Encounter for screening mammogram for malignant neoplasm of breast: Secondary | ICD-10-CM | POA: Insufficient documentation

## 2016-12-15 DIAGNOSIS — Z780 Asymptomatic menopausal state: Secondary | ICD-10-CM | POA: Insufficient documentation

## 2016-12-15 DIAGNOSIS — M818 Other osteoporosis without current pathological fracture: Secondary | ICD-10-CM | POA: Diagnosis not present

## 2016-12-15 DIAGNOSIS — M810 Age-related osteoporosis without current pathological fracture: Secondary | ICD-10-CM | POA: Diagnosis not present

## 2016-12-15 DIAGNOSIS — E039 Hypothyroidism, unspecified: Secondary | ICD-10-CM | POA: Diagnosis not present

## 2016-12-25 DIAGNOSIS — M810 Age-related osteoporosis without current pathological fracture: Secondary | ICD-10-CM | POA: Insufficient documentation

## 2016-12-25 NOTE — Progress Notes (Signed)
Chief Complaint  Patient presents with  . Advice Only    osteporosis treatment options.     Patient presents to discuss bone density results from 12/15/16, which showed osteoporosis: ASSESSMENT: The BMD measured at AP Spine L1-L4 is 0.888 g/cm2 with a T-score of -2.5. This patient is considered osteoporotic according to [facility]) criteria. Site Region Measured Measured WHO Young Adult BMD Date       Age      Classification T-score AP Spine L1-L4 12/15/2016 [age] Osteoporosis -2.5 0.888 g/cm2 DualFemur Neck Right 12/15/2016 65.3 Osteopenia -1.6 0.821 g/cm2   Hypothyroidism: Lab Results  Component Value Date   TSH 2.52 08/25/2016  She feels like thyroid is doing well  (declines check while here today).   PMH, PSH, SH reviewed  Outpatient Encounter Prescriptions as of 12/26/2016  Medication Sig Note  . desvenlafaxine (PRISTIQ) 50 MG 24 hr tablet Take 50 mg by mouth daily.     Marland Kitchen ezetimibe (ZETIA) 10 MG tablet Take 1 tablet (10 mg total) by mouth daily.   Marland Kitchen L-Methylfolate (DEPLIN PO) Take by mouth.   . levothyroxine (SYNTHROID, LEVOTHROID) 25 MCG tablet TAKE 1 TABLET (25 MCG TOTAL) BY MOUTH DAILY.   . metoprolol succinate (TOPROL-XL) 25 MG 24 hr tablet Take 1 tablet (25 mg total) by mouth daily.   . Multiple Vitamin (MULTIVITAMIN) tablet Take 1 tablet by mouth daily. 10/24/2016: Not in the last 2 days  . rOPINIRole (REQUIP) 0.25 MG tablet Take 0.25 mg by mouth 3 (three) times a week. Take 1 tab by mouth 1-2 hours before bed time, may increase to 5 tabs daily   . fluticasone (FLONASE) 50 MCG/ACT nasal spray Place 1 spray into both nostrils daily. 10/24/2016: Uses prn--used 2 days last week.  Marland Kitchen LORazepam (ATIVAN) 0.5 MG tablet Take 0.5 mg by mouth as needed.   11/14/2013: Uses once a week or less  . ranitidine (ZANTAC) 150 MG tablet Take 150 mg by mouth as needed.   10/24/2016: Took some over the weekend  . [DISCONTINUED] Guaifenesin (MUCINEX MAXIMUM STRENGTH) 1200  MG TB12 Take 1 tablet by mouth 2 (two) times daily.   . [DISCONTINUED] rOPINIRole (REQUIP) 0.5 MG tablet     No facility-administered encounter medications on file as of 12/26/2016.    Allergies  Allergen Reactions  . Zithromax [Azithromycin] Hives   ROS: no fever, chills, URI symptoms, chest pain, palpitations, dysphagia, cough, shortness of breath.  +mild hot flashes (at night, sometimes interfere with sleep).  Moods are good.  No nausea, vomiting, bowel changes, bleeding, bruising or other concerns. See HPI  PHYSICAL EXAM:  BP 122/80 (BP Location: Left Arm, Patient Position: Sitting, Cuff Size: Normal)   Pulse 80   Ht 5' 6.5" (1.689 m)   Wt 191 lb 3.2 oz (86.7 kg)   BMI 30.40 kg/m   Well appearing, pleasant female in no distress Normal mood, affect, hygiene and grooming Alert and oriented, cranial nerves grossly intact, normal gait   ASSESSMENT/PLAN:  Osteoporosis without current pathological fracture, unspecified osteoporosis type  Discussion: She had dental implant last year, no ongoing problems, no jaw pain H/o reflux, no dysphagia. She is hesitant to start meds that could potentially trigger any esophageal symptoms, as she fees like she finally has her reflux under control. Still has some hot flashes, which can affect her sleep. Also has RLS--worries about side effects from Evista. Concerned about the timing of bisphosphonate medications and when to take her thyroid medication. We discussed reclast infusion (  which wouldn't affect timing), but recommended trying an oral version first prior to the infusion (due to risk of side effects which would last much longer).  She prefers trial of Prolia.  (depending on the cost, if not covered, she might consider payiong out of pocket if not covered; vs needing to try bisphosphonate.)  Send to Mickel Baas to look into Prolia coverage for this patient.  25-30 min visit counseling re: diagnosis and treatment of osteoporosis, and  counseling re: risks/side effects and all treatment alternatives for osteoporosis. Discussed bone density vs fracture risk.

## 2016-12-26 ENCOUNTER — Encounter: Payer: Self-pay | Admitting: Family Medicine

## 2016-12-26 ENCOUNTER — Ambulatory Visit (INDEPENDENT_AMBULATORY_CARE_PROVIDER_SITE_OTHER): Payer: Medicare Other | Admitting: Family Medicine

## 2016-12-26 DIAGNOSIS — M810 Age-related osteoporosis without current pathological fracture: Secondary | ICD-10-CM | POA: Diagnosis not present

## 2016-12-26 NOTE — Patient Instructions (Signed)
We discussed bisphosphonates (alendronate/fosamax weekly vs monthly (actonel/Boniva) vs yearly infusion (Reclast)), and Prolia (injections every 6 months) and Evista (daily medication). We will look into coverage for Prolia.  It is possible that you may need to try/fail bisphosphonate before this is covered, but we will provide the information about reflux and thyroid medications to see if that helps.  It might take a few weeks to hear back.  The contact person is Mickel Baas in our office.    Osteoporosis Osteoporosis is the thinning and loss of density in the bones. Osteoporosis makes the bones more brittle, fragile, and likely to break (fracture). Over time, osteoporosis can cause the bones to become so weak that they fracture after a simple fall. The bones most likely to fracture are the bones in the hip, wrist, and spine. What are the causes? The exact cause is not known. What increases the risk? Anyone can develop osteoporosis. You may be at greater risk if you have a family history of the condition or have poor nutrition. You may also have a higher risk if you are:  Female.  66 years old or older.  A smoker.  Not physically active.  White or Asian.  Slender. What are the signs or symptoms? A fracture might be the first sign of the disease, especially if it results from a fall or injury that would not usually cause a bone to break. Other signs and symptoms include:  Low back and neck pain.  Stooped posture.  Height loss. How is this diagnosed? To make a diagnosis, your health care provider may:  Take a medical history.  Perform a physical exam.  Order tests, such as:  A bone mineral density test.  A dual-energy X-ray absorptiometry test. How is this treated? The goal of osteoporosis treatment is to strengthen your bones to reduce your risk of a fracture. Treatment may involve:  Making lifestyle changes, such as:  Eating a diet rich in calcium.  Doing weight-bearing  and muscle-strengthening exercises.  Stopping tobacco use.  Limiting alcohol intake.  Taking medicine to slow the process of bone loss or to increase bone density.  Monitoring your levels of calcium and vitamin D. Follow these instructions at home:  Include calcium and vitamin D in your diet. Calcium is important for bone health, and vitamin D helps the body absorb calcium.  Perform weight-bearing and muscle-strengthening exercises as directed by your health care provider.  Do not use any tobacco products, including cigarettes, chewing tobacco, and electronic cigarettes. If you need help quitting, ask your health care provider.  Limit your alcohol intake.  Take medicines only as directed by your health care provider.  Keep all follow-up visits as directed by your health care provider. This is important.  Take precautions at home to lower your risk of falling, such as:  Keeping rooms well lit and clutter free.  Installing safety rails on stairs.  Using rubber mats in the bathroom and other areas that are often wet or slippery. Get help right away if: You fall or injure yourself. This information is not intended to replace advice given to you by your health care provider. Make sure you discuss any questions you have with your health care provider. Document Released: 08/31/2005 Document Revised: 04/25/2016 Document Reviewed: 05/01/2014 Elsevier Interactive Patient Education  2017 [employer].

## 2016-12-28 ENCOUNTER — Other Ambulatory Visit: Payer: Self-pay | Admitting: Cardiovascular Disease

## 2016-12-29 NOTE — Telephone Encounter (Signed)
Rx(s) sent to pharmacy electronically.  

## 2017-01-09 DIAGNOSIS — L570 Actinic keratosis: Secondary | ICD-10-CM | POA: Diagnosis not present

## 2017-01-09 DIAGNOSIS — L812 Freckles: Secondary | ICD-10-CM | POA: Diagnosis not present

## 2017-01-09 DIAGNOSIS — L811 Chloasma: Secondary | ICD-10-CM | POA: Diagnosis not present

## 2017-02-08 ENCOUNTER — Other Ambulatory Visit: Payer: Self-pay | Admitting: Family Medicine

## 2017-02-13 ENCOUNTER — Ambulatory Visit: Payer: Medicare Other | Admitting: Family Medicine

## 2017-02-15 ENCOUNTER — Ambulatory Visit (INDEPENDENT_AMBULATORY_CARE_PROVIDER_SITE_OTHER): Payer: Medicare Other | Admitting: Family Medicine

## 2017-02-15 ENCOUNTER — Telehealth: Payer: Self-pay | Admitting: Family Medicine

## 2017-02-15 ENCOUNTER — Encounter: Payer: Self-pay | Admitting: Family Medicine

## 2017-02-15 VITALS — BP 112/72 | HR 68 | Ht 66.5 in | Wt 189.8 lb

## 2017-02-15 DIAGNOSIS — K219 Gastro-esophageal reflux disease without esophagitis: Secondary | ICD-10-CM | POA: Diagnosis not present

## 2017-02-15 DIAGNOSIS — K449 Diaphragmatic hernia without obstruction or gangrene: Secondary | ICD-10-CM

## 2017-02-15 DIAGNOSIS — G4700 Insomnia, unspecified: Secondary | ICD-10-CM | POA: Diagnosis not present

## 2017-02-15 MED ORDER — ESOMEPRAZOLE MAGNESIUM 40 MG PO CPDR: 40.0000 mg | DELAYED_RELEASE_CAPSULE | Freq: Every day | ORAL | 3 refills | Status: AC

## 2017-02-15 NOTE — Patient Instructions (Addendum)
Restart Nexium 40mg  once daily (take before breakfast if daytime symptoms are the worst, or before dinner if night-time symptoms are worse).  If you have ongoing symptoms as it wears off, you can consider taking it twice daily for up to 2 weeks.  I recommend using the Nexium for 1-2 months, then try tapering back down to the 150mg  twice daily of Zantac (or 20mg  twice daily of Pepcid).  Please limit the foods that trigger reflux--see handout.  If you need to stay on the Nexium long-term (because you have recurrent symptoms on the zantac), then be sure to take supplements to include magnesium, B12, iron and Calcium.    Food Choices for Gastroesophageal Reflux Disease, Adult When you have gastroesophageal reflux disease (GERD), the foods you eat and your eating habits are very important. Choosing the right foods can help ease the discomfort of GERD. Consider working with a diet and nutrition specialist (dietitian) to help you make healthy food choices. What general guidelines should I follow? Eating plan   Choose healthy foods low in fat, such as fruits, vegetables, whole grains, low-fat dairy products, and lean meat, fish, and poultry.  Eat frequent, small meals instead of three large meals each day. Eat your meals slowly, in a relaxed setting. Avoid bending over or lying down until 2-3 hours after eating.  Limit high-fat foods such as fatty meats or fried foods.  Limit your intake of oils, butter, and shortening to less than 8 teaspoons each day.  Avoid the following:  Foods that cause symptoms. These may be different for different people. Keep a food diary to keep track of foods that cause symptoms.  Alcohol.  Drinking large amounts of liquid with meals.  Eating meals during the 2-3 hours before bed.  Cook foods using methods other than frying. This may include baking, grilling, or broiling. Lifestyle    Maintain a healthy weight. Ask your health care provider what weight is  healthy for you. If you need to lose weight, work with your health care provider to do so safely.  Exercise for at least 30 minutes on 5 or more days each week, or as told by your health care provider.  Avoid wearing clothes that fit tightly around your waist and chest.  Do not use any products that contain nicotine or tobacco, such as cigarettes and e-cigarettes. If you need help quitting, ask your health care provider.  Sleep with the head of your bed raised. Use a wedge under the mattress or blocks under the bed frame to raise the head of the bed. What foods are not recommended? The items listed may not be a complete list. Talk with your dietitian about what dietary choices are best for you. Grains  Pastries or quick breads with added fat. [place] toast. Vegetables  Deep fried vegetables. [place] fries. Any vegetables prepared with added fat. Any vegetables that cause symptoms. For some people this may include tomatoes and tomato products, chili peppers, onions and garlic, and horseradish. Fruits  Any fruits prepared with added fat. Any fruits that cause symptoms. For some people this may include citrus fruits, such as oranges, grapefruit, pineapple, and lemons. Meats and other protein foods  High-fat meats, such as fatty beef or pork, hot dogs, ribs, ham, sausage, salami and bacon. Fried meat or protein, including fried fish and fried chicken. Nuts and nut butters. Dairy  Whole milk and chocolate milk. Sour cream. Cream. Ice cream. Cream cheese. Milk shakes. Beverages  Coffee and tea, with or  without caffeine. Carbonated beverages. Sodas. Energy drinks. Fruit juice made with acidic fruits (such as orange or grapefruit). Tomato juice. Alcoholic drinks. Fats and oils  Butter. Margarine. Shortening. Ghee. Sweets and desserts  Chocolate and cocoa. Donuts. Seasoning and other foods  Pepper. Peppermint and spearmint. Any condiments, herbs, or seasonings that cause symptoms. For some people,  this may include curry, hot sauce, or vinegar-based salad dressings. Summary  When you have gastroesophageal reflux disease (GERD), food and lifestyle choices are very important to help ease the discomfort of GERD.  Eat frequent, small meals instead of three large meals each day. Eat your meals slowly, in a relaxed setting. Avoid bending over or lying down until 2-3 hours after eating.  Limit high-fat foods such as fatty meat or fried foods. This information is not intended to replace advice given to you by your health care provider. Make sure you discuss any questions you have with your health care provider. Document Released: 11/21/2005 Document Revised: 11/22/2016 Document Reviewed: 11/22/2016 Elsevier Interactive Patient Education  2017 [place].   Insomnia Insomnia is a sleep disorder that makes it difficult to fall asleep or to stay asleep. Insomnia can cause tiredness (fatigue), low energy, difficulty concentrating, mood swings, and poor performance at work or school. There are three different ways to classify insomnia:  Difficulty falling asleep.  Difficulty staying asleep.  Waking up too early in the morning. Any type of insomnia can be long-term (chronic) or short-term (acute). Both are common. Short-term insomnia usually lasts for three months or less. Chronic insomnia occurs at least three times a week for longer than three months. What are the causes? Insomnia may be caused by another condition, situation, or substance, such as:  Anxiety.  Certain medicines.  Gastroesophageal reflux disease (GERD) or other gastrointestinal conditions.  Asthma or other breathing conditions.  Restless legs syndrome, sleep apnea, or other sleep disorders.  Chronic pain.  Menopause. This may include hot flashes.  Stroke.  Abuse of alcohol, tobacco, or illegal drugs.  Depression.  Caffeine.  Neurological disorders, such as Alzheimer disease.  An overactive thyroid  (hyperthyroidism). The cause of insomnia may not be known. What increases the risk? Risk factors for insomnia include:  Gender. Women are more commonly affected than men.  Age. Insomnia is more common as you get older.  Stress. This may involve your professional or personal life.  Income. Insomnia is more common in people with lower income.  Lack of exercise.  Irregular work schedule or night shifts.  Traveling between different time zones. What are the signs or symptoms? If you have insomnia, trouble falling asleep or trouble staying asleep is the main symptom. This may lead to other symptoms, such as:  Feeling fatigued.  Feeling nervous about going to sleep.  Not feeling rested in the morning.  Having trouble concentrating.  Feeling irritable, anxious, or depressed. How is this treated? Treatment for insomnia depends on the cause. If your insomnia is caused by an underlying condition, treatment will focus on addressing the condition. Treatment may also include:  Medicines to help you sleep.  Counseling or therapy.  Lifestyle adjustments. Follow these instructions at home:  Take medicines only as directed by your health care provider.  Keep regular sleeping and waking hours. Avoid naps.  Keep a sleep diary to help you and your health care provider figure out what could be causing your insomnia. Include:  When you sleep.  When you wake up during the night.  How well you sleep.  How rested you feel the next day.  Any side effects of medicines you are taking.  What you eat and drink.  Make your bedroom a comfortable place where it is easy to fall asleep:  Put up shades or special blackout curtains to block light from outside.  Use a white noise machine to block noise.  Keep the temperature cool.  Exercise regularly as directed by your health care provider. Avoid exercising right before bedtime.  Use relaxation techniques to manage stress. Ask your  health care provider to suggest some techniques that may work well for you. These may include:  Breathing exercises.  Routines to release muscle tension.  Visualizing peaceful scenes.  Cut back on alcohol, caffeinated beverages, and cigarettes, especially close to bedtime. These can disrupt your sleep.  Do not overeat or eat spicy foods right before bedtime. This can lead to digestive discomfort that can make it hard for you to sleep.  Limit screen use before bedtime. This includes:  Watching TV.  Using your smartphone, tablet, and computer.  Stick to a routine. This can help you fall asleep faster. Try to do a quiet activity, brush your teeth, and go to bed at the same time each night.  Get out of bed if you are still awake after 15 minutes of trying to sleep. Keep the lights down, but try reading or doing a quiet activity. When you feel sleepy, go back to bed.  Make sure that you drive carefully. Avoid driving if you feel very sleepy.  Keep all follow-up appointments as directed by your health care provider. This is important. Contact a health care provider if:  You are tired throughout the day or have trouble in your daily routine due to sleepiness.  You continue to have sleep problems or your sleep problems get worse. Get help right away if:  You have serious thoughts about hurting yourself or someone else. This information is not intended to replace advice given to you by your health care provider. Make sure you discuss any questions you have with your health care provider. Document Released: 11/18/2000 Document Revised: 04/22/2016 Document Reviewed: 08/22/2014 Elsevier Interactive Patient Education  2017 [employer].

## 2017-02-15 NOTE — Progress Notes (Signed)
Chief Complaint  Patient presents with  . Advice Only    thinks she may have possible acid reflux. About a week after last visit starting having symptoms. Took a zantac. Has been having burning.     She was last seen in January, for discussion of her bone density results (osteoporosis).  She started with recurrent heartburn shortly after her last visit. It started out fairly mild, getting progressively worse.  Sometimes feels the regurgitation all the way up to her neck/throat.  Denies dysphagia. Burning has been pretty constant over the last week. She has been taking zantac 150mg  twice daily over the last 5 days--prior to that had only been using it once daily.  Increasing to BID has helped.  Seems to be worse after pilates (11:30 am, not eating much prior). She has no problems with heartburn/reflux/chest pain/dyspnea while on the exercise bike.   She has h/o GERD and HH.  She had taken Nexium for a long time (approx 10 years), has been off for about 5 years.  She stopped due to potential concerns with long-term use, not any side effects.  She had a hard time the first 2 weeks after stopping, zantac helped, and then really hadn't had a problem unless related to a particular food, since that time until the end of January.  When symptoms started to recur a couple of months ago, she had been eating an orange at bedtime for a week. Denies other dietary changes.  She is also having trouble sleeping.  The NP at Dr. Arvil Persons office suggested Valerian root--took it for two days in a row, and made her feel worse. She was already having some reflux symptoms prior to taking this, but it made it worse. She used to see a psychiatrist (Dr. Geryl Councilman) for hourly visits, with therapy.  She changed to Dr. Caprice Beaver, who recently left practice. She is asking for other recommendations for psychiatrists, possibly one who might also do therapy visits.  She has been using lorazepam as needed for her insomnia, just prn--she is  afraid of getting "hooked on it". She reports that she has tried MANY other sleep meds through her psychiatrists, including Belsomra among others.  PMH, PSH, SH reviewed   Outpatient Encounter Prescriptions as of 02/15/2017  Medication Sig Note  . cetirizine (ZYRTEC) 10 MG tablet Take 10 mg by mouth daily.   Marland Kitchen desvenlafaxine (PRISTIQ) 50 MG 24 hr tablet Take 50 mg by mouth daily.     Marland Kitchen ezetimibe (ZETIA) 10 MG tablet TAKE 1 TABLET (10 MG TOTAL) BY MOUTH DAILY.   Marland Kitchen L-Methylfolate (DEPLIN PO) Take by mouth.   . levothyroxine (SYNTHROID, LEVOTHROID) 25 MCG tablet TAKE 1 TABLET (25 MCG TOTAL) BY MOUTH DAILY.   Marland Kitchen LORazepam (ATIVAN) 0.5 MG tablet Take 0.5 mg by mouth as needed.   11/14/2013: Uses once a week or less  . metoprolol succinate (TOPROL-XL) 25 MG 24 hr tablet Take 1 tablet (25 mg total) by mouth daily.   . Multiple Vitamin (MULTIVITAMIN) tablet Take 1 tablet by mouth daily. 10/24/2016: Not in the last 2 days  . ranitidine (ZANTAC) 150 MG tablet Take 150 mg by mouth 2 (two) times daily.  10/24/2016: Took some over the weekend  . rOPINIRole (REQUIP) 0.25 MG tablet Take 0.25 mg by mouth 3 (three) times a week. Take 1 tab by mouth 1-2 hours before bed time, may increase to 5 tabs daily   . fluticasone (FLONASE) 50 MCG/ACT nasal spray Place 1 spray into both nostrils  daily. 10/24/2016: Uses prn--used 2 days last week.   No facility-administered encounter medications on file as of 02/15/2017.    Allergies  Allergen Reactions  . Zithromax [Azithromycin] Hives   ROS:  No fever, chills, URI symptoms, headaches, dizziness, exertional chest pain or dyspnea.  +reflux symptoms per HPI.  Denies dysphagia, or bowel changes. No bleeding, bruising, rash.  Moods are okay, but +insomnia per HPI.  See HPI   PHYSICAL EXAM:  BP 112/72 (BP Location: Left Arm, Patient Position: Sitting, Cuff Size: Normal)   Pulse 68   Ht 5' 6.5" (1.689 m)   Wt 189 lb 12.8 oz (86.1 kg)   BMI 30.18 kg/m   Well  appearing, pleasant, overweight female in no distress HEENT: conjunctiva and sclera are clear. OP clear Neck: no lymphadenopathy, thyromegaly or mass Heart: regular rate and rhythm Lungs: clear bilaterally Abdomen: soft, nontender, no mass Chest: nontender Extremities: no edema Neuro: alert and oriented, cranial nerves intact, normal gait Psych: normal mood, affect, hygiene and grooming  ASSESSMENT/PLAN:  Gastroesophageal reflux disease, esophagitis presence not specified - reviewed risks/side effects of PPI vs H2 blocker; Diet reviewed. Restart Nexium x 1-2 mos, then try taper back to H2 blocker. supplements reviewed  Hiatal hernia - educated on anatomy, symptoms of reflux, need for meds  Insomnia, unspecified type - counseled some re: sleep hygiene; discussed risks of benzo's--not nec rec for daily use for sleep. Rec Dr. Toy Care as psych replacement  25-30 min visit, more than 1/2 spent counseling.   Restart Nexium 40mg  once daily (take before breakfast if daytime symptoms are the worst, or before dinner if night-time symptoms are worse).  If you have ongoing symptoms as it wears off, you can consider taking it twice daily for up to 2 weeks.  I recommend using the Nexium for 1-2 months, then try tapering back down to the 150mg  twice daily of Zantac (or 20mg  twice daily of Pepcid).  Please limit the foods that trigger reflux--see handout.  If you need to stay on the Nexium long-term (because you have recurrent symptoms on the zantac), then be sure to take supplements to include magnesium, B12, iron and Calcium.

## 2017-02-17 NOTE — Telephone Encounter (Signed)
Recv'd insurance verification states $0 out of pocket.  Called pt and advised estimated $0 out of pocket but advised may be responsible for nurse visit cost.  Sent message to Beverlee Nims to order Prolia and scheduled nurse visit.

## 2017-02-23 ENCOUNTER — Other Ambulatory Visit: Payer: Medicare Other

## 2017-03-01 ENCOUNTER — Other Ambulatory Visit (INDEPENDENT_AMBULATORY_CARE_PROVIDER_SITE_OTHER): Payer: Medicare Other

## 2017-03-01 DIAGNOSIS — M25561 Pain in right knee: Secondary | ICD-10-CM | POA: Diagnosis not present

## 2017-03-01 DIAGNOSIS — M810 Age-related osteoporosis without current pathological fracture: Secondary | ICD-10-CM | POA: Diagnosis not present

## 2017-03-01 DIAGNOSIS — M25562 Pain in left knee: Secondary | ICD-10-CM | POA: Diagnosis not present

## 2017-03-01 MED ORDER — DENOSUMAB 60 MG/ML ~~LOC~~ SOLN
60.0000 mg | Freq: Once | SUBCUTANEOUS | Status: AC
  Administered 2017-03-01: 60 mg via SUBCUTANEOUS

## 2017-03-14 DIAGNOSIS — L99 Other disorders of skin and subcutaneous tissue in diseases classified elsewhere: Secondary | ICD-10-CM | POA: Diagnosis not present

## 2017-03-14 DIAGNOSIS — L570 Actinic keratosis: Secondary | ICD-10-CM | POA: Diagnosis not present

## 2017-03-14 DIAGNOSIS — L719 Rosacea, unspecified: Secondary | ICD-10-CM | POA: Diagnosis not present

## 2017-03-14 DIAGNOSIS — L578 Other skin changes due to chronic exposure to nonionizing radiation: Secondary | ICD-10-CM | POA: Diagnosis not present

## 2017-03-28 ENCOUNTER — Encounter: Payer: Self-pay | Admitting: Cardiovascular Disease

## 2017-03-28 ENCOUNTER — Ambulatory Visit (INDEPENDENT_AMBULATORY_CARE_PROVIDER_SITE_OTHER): Payer: Medicare Other | Admitting: Cardiovascular Disease

## 2017-03-28 VITALS — BP 128/88 | HR 78 | Ht 66.0 in | Wt 192.0 lb

## 2017-03-28 DIAGNOSIS — R002 Palpitations: Secondary | ICD-10-CM | POA: Diagnosis not present

## 2017-03-28 DIAGNOSIS — G2581 Restless legs syndrome: Secondary | ICD-10-CM

## 2017-03-28 DIAGNOSIS — E7800 Pure hypercholesterolemia, unspecified: Secondary | ICD-10-CM | POA: Diagnosis not present

## 2017-03-28 DIAGNOSIS — Z79899 Other long term (current) drug therapy: Secondary | ICD-10-CM

## 2017-03-28 DIAGNOSIS — E039 Hypothyroidism, unspecified: Secondary | ICD-10-CM | POA: Diagnosis not present

## 2017-03-28 DIAGNOSIS — R202 Paresthesia of skin: Secondary | ICD-10-CM | POA: Diagnosis not present

## 2017-03-28 MED ORDER — ROSUVASTATIN CALCIUM 10 MG PO TABS: 10.0000 mg | ORAL_TABLET | Freq: Every day | ORAL | 6 refills | Status: DC

## 2017-03-28 NOTE — Progress Notes (Signed)
Patient ID: Stacy Carrillo, female   DOB: 08/04/1951, 66 y.o.   MRN: [id number]    Primary MD: Dr. Joselyn Arrow  HPI: Stacy Carrillo is a 66 y.o. female who presents for a 12 month followup cardiologic evaluation.   Stacy Carrillo has a strong family history for CAD as well as hyperlipidemia. In June 2013 her total cholesterol was 273 with an LDL of 186 HDL 16 and triglycerides 94. An NMR profile showed an LDL particle #1940. She has a history of hypothyroidism on Synthroid replacement as well as migraine headaches. When I saw her in November 2013 I obtained a [facility] lab assessment with genetic profile I recommended at least a trial of Zetia in light of her marked hyperlipidemia.  She was against statin initiation and did not want to initiate therapy Zetia and sought nutritional guidance.  She significantly increased her exercise and lost approximately a 30 pound weight loss over a year.  A repeat NMR profile on 05/09/2012: total cholesterol was still elevated at 222, HDL was excellent at 71 LDL was 137. Her LDL small particles were normal at 433 but she still had increased LDL particle number of 1578 nmol per liter.  As result, I recommend initiation of Zetia 10 mg, which she conceded to initiate.  An echo Doppler study in September 2013 was essentially normal and showed an EF of greater than 55%.  There was trace tricuspid regurgitation.  She had normal right ventricular systolic pressure.  Repeat laboratory in September 2015  showed a total cholesterol 183, triglycerides 75, HDL 70, VLDL 15, and LDL cholesterol at 98.  A single blood sugar was 101.  She normal renal function.  She normal electrolytes.  Thyroid function was normal.  When I saw her last year she  had difficulty with musculoskeletal issues.  She has had hip pain and is diagnosed with an external oblique tear as well as gluteal tear.  She has not been able to exercise as regularly.    She has remained active.  She is  exercising and doing Pilates at least 3 days per week.  She does have some issues with anxiety. She has occasional chest fluttering and heart skipping. She underwent a sleep study and was  told of not having obstructive sleep apnea. I have personally reviewed this study which was done at [facility] on 11/23/2015. Her AHI was 2.4  She slept with 97.4% sleep efficiency. Her oxygen saturation nadir was 90%. She denies any chest pain.    Due to palpitations, I initiated low-dose Toprol-XL at 12.5 mg and sagittally titrated this to 12.5 mg.  Since I last saw her, she has been taking Zetia for hyperlipidemia.  She has restless legs and is on reck with.  She is on low-dose levothyroxine at 25 g for mild hypothyroidism.  She has been followed by Dr. Elesa Massed.  She has had reflux symptoms and is taking Zantac 150 mg in addition to Nexium.  In September 2017.  She had blood work done by Dr. Lynelle Doctor, which showed her cholesterol had increased to 22 and LDL 140.  Her thyroid function was normal.  She normal renal function.  Hemoglobin A1c had improved to 5.4.  She denies any chest pain.  For heartburn is better since initiating Nexium.  At times she notes some tingling of her hands and feet.  She continues to do Pilates 3 days per week and uses an exercise bike at least 2-3 days per week.  She presents for evaluation.  Past Medical History:  Diagnosis Date  . Anxiety    GENERALIZED/WILLIAM CHEN MD  . Colon polyps   . Depression panic(dr.[email or web address] chen)  . Elevated cholesterol   . GERD (gastroesophageal reflux disease) HH per pt EGD nl 2/06  . Hypothyroid   . IBS (irritable bowel syndrome) constipation(dr elliot [place])  . OSA (obstructive sleep apnea) moderate, not using CPAP, aware of risks   2007; sleep study 11/2015 NORMAL  . Osteopenia hx  . RLS (restless legs syndrome)   . Vitamin D deficiency     Past Surgical History:  Procedure Laterality Date  . ABDOMINAL HYSTERECTOMY  1999   and BSO  (for benign reasons)  . APPENDECTOMY  1999  . BREAST BIOPSY Left    bx or asp not sure  . COLONOSCOPY  2011   Dr. Vira Agar [facility])  . COLONOSCOPY WITH PROPOFOL N/A 01/20/2016   Procedure: COLONOSCOPY WITH PROPOFOL;  Surgeon: Manya Silvas, MD;  Location: [facility] ENDOSCOPY;  Service: Endoscopy;  Laterality: N/A;  . ESOPHAGOGASTRODUODENOSCOPY  2009  . TONSILLECTOMY      Allergies  Allergen Reactions  . Zithromax [Azithromycin] Hives    Current Outpatient Prescriptions  Medication Sig Dispense Refill  . cetirizine (ZYRTEC) 10 MG tablet Take 10 mg by mouth daily.    Marland Kitchen desvenlafaxine (PRISTIQ) 50 MG 24 hr tablet Take 50 mg by mouth daily.      Marland Kitchen esomeprazole (NEXIUM) 40 MG capsule Take 1 capsule (40 mg total) by mouth daily. 30 capsule 3  . ezetimibe (ZETIA) 10 MG tablet TAKE 1 TABLET (10 MG TOTAL) BY MOUTH DAILY. 30 tablet 3  . fluticasone (FLONASE) 50 MCG/ACT nasal spray Place 1 spray into both nostrils daily.    Marland Kitchen L-Methylfolate (DEPLIN PO) Take by mouth.    . levothyroxine (SYNTHROID, LEVOTHROID) 25 MCG tablet TAKE 1 TABLET (25 MCG TOTAL) BY MOUTH DAILY. 90 tablet 0  . LORazepam (ATIVAN) 0.5 MG tablet Take 0.5 mg by mouth as needed.      . metoprolol succinate (TOPROL-XL) 25 MG 24 hr tablet Take 1 tablet (25 mg total) by mouth daily. 30 tablet 9  . Multiple Vitamin (MULTIVITAMIN) tablet Take 1 tablet by mouth daily.    . ranitidine (ZANTAC) 150 MG tablet Take 150 mg by mouth 2 (two) times daily.     Marland Kitchen rOPINIRole (REQUIP) 0.25 MG tablet Take 0.25 mg by mouth 3 (three) times a week. Take 1 tab by mouth 1-2 hours before bed time, may increase to 5 tabs daily    . rosuvastatin (CRESTOR) 10 MG tablet Take 1 tablet (10 mg total) by mouth daily. 30 tablet 6   No current facility-administered medications for this visit.     Socially she is married to Stacy Carrillo.  There is no tobacco use.  She does exercise and performs Pilates 3 days per week.  ROS General:  Negative; No fevers, chills, or night sweats; positive for weight gain HEENT: Negative; No changes in vision or hearing, sinus congestion, difficulty swallowing Pulmonary: Negative; No cough, wheezing, shortness of breath, hemoptysis Cardiovascular: See history of present illness GI: Positive for GERD GU: Negative; No dysuria, hematuria, or difficulty voiding Musculoskeletal: Positive for recent external oblique tear as well as gluteus tear Hematologic/Oncology: Negative; no easy bruising, bleeding Endocrine: She had been started on low-dose levothyroxine 25 g.  No diabetes Neuro: Negative; no changes in balance, headaches Skin: Negative; No rashes or skin lesions Psychiatric: Positive for mild anxiety  Sleep:  Positive for restless legs; No snoring, daytime sleepiness, hypersomnolence, bruxism, hypnogognic hallucinations, no cataplexy Other comprehensive 14 point system review is negative.   PE BP 128/88   Pulse 78   Ht '5\' 6"'$  (1.676 m)   Wt 192 lb (87.1 kg)   BMI 30.99 kg/m    Repeat blood pressure by me 118/78  Wt Readings from Last 3 Encounters:  03/28/17 192 lb (87.1 kg)  02/15/17 189 lb 12.8 oz (86.1 kg)  12/26/16 191 lb 3.2 oz (86.7 kg)   General: Alert, oriented, no distress.  Skin: normal turgor, no rashes HEENT: Normocephalic, atraumatic. Pupils round and reactive; sclera anicteric;no lid lag,  Nose without nasal septal hypertrophy Mouth/Parynx benign; Mallinpatti scale 2 Neck: No JVD, no carotid bruits with normal carotid upstroke Lungs: clear to ausculatation and percussion; no wheezing or rales Heart: RRR, s1 s2 normal; very faint 1/6 systolic murmur; no diastolic murmur. Abdomen: soft, nontender; no hepatosplenomehaly, BS+; abdominal aorta nontender and not dilated by palpation. Pulses 2+ Extremities: no clubbing cyanosis or edema, Homan's sign negative  Neurologic: grossly nonfocal Psychological: Normal affect and mood  ECG (independently read by me): Normal  sinus rhythm at 78 bpm.  Normal intervals.  No ST segment changes.  April 2017 ECG (independently read by me): Normal sinus rhythm at 74 bpm.  Normal intervals.  No ectopy.  January 2017 ECG (independently read by me): normal sinus rhythm at 74 bpm.  Normal intervals.  December 2015 ECG (and apparently read by me):  Normal sinus rhythm at 75 bpm.  No ectopy.  Normal intervals.  Prior June 2014 ECG: Normal sinus rhythm at 65 beats per minute   LABS: BMP Latest Ref Rng & Units 08/25/2016 08/21/2015 01/08/2015  Glucose 65 - 99 mg/dL 103(H) 94 94  BUN 7 - 25 mg/dL '12 11 20  '$ Creatinine 0.50 - 0.99 mg/dL 0.99 0.99 0.94  Sodium 135 - 146 mmol/L 140 141 139  Potassium 3.5 - 5.3 mmol/L 4.6 4.5 4.1  Chloride 98 - 110 mmol/L 106 106 104  CO2 20 - 31 mmol/L '24 26 27  '$ Calcium 8.6 - 10.4 mg/dL 9.1 9.0 9.4   Hepatic Function Latest Ref Rng & Units 08/25/2016 08/21/2015 08/19/2014  Total Protein 6.1 - 8.1 g/dL 6.7 6.5 6.9  Albumin 3.6 - 5.1 g/dL 4.0 4.1 4.1  AST 10 - 35 U/L '23 23 23  '$ ALT 6 - 29 U/L '19 18 18  '$ Alk Phosphatase 33 - 130 U/L 47 68 52  Total Bilirubin 0.2 - 1.2 mg/dL 0.6 0.6 0.4   CBC Latest Ref Rng & Units 08/25/2016 08/21/2015 08/19/2014  WBC 4.0 - 10.5 K/uL 4.6 3.8(L) 4.1  Hemoglobin 11.7 - 15.5 g/dL 14.0 14.0 13.8  Hematocrit 35.0 - 45.0 % 42.1 42.1 39.6  Platelets 140 - 400 K/uL 258 262 227   Lab Results  Component Value Date   MCV 92.1 08/25/2016   MCV 93.3 08/21/2015   MCV 89.2 08/19/2014   Lab Results  Component Value Date   TSH 2.52 08/25/2016   Lab Results  Component Value Date   HGBA1C 5.4 08/25/2016   Lipid Panel     Component Value Date/Time   CHOL 222 (H) 08/25/2016 1037   CHOL 222 (H) 05/09/2013 1117   TRIG 99 08/25/2016 1037   TRIG 70 05/09/2013 1117   HDL 62 08/25/2016 1037   HDL 71 05/09/2013 1117   CHOLHDL 3.6 08/25/2016 1037   VLDL 20 08/25/2016 1037   LDLCALC 140 (H)  08/25/2016 1037   LDLCALC 137 (H) 05/09/2013 1117     RADIOLOGY: No results  found.  IMPRESSION: 1. Pure hypercholesterolemia   2. Medication management   3. Palpitations   4. Hypothyroidism, unspecified type   5. Restless legs syndrome   6. Paresthesias     ASSESSMENT AND PLAN: Stacy Carrillo is a 66 year old WF who has a history of hyperlipidemia and has refused consideration to take statins but has been tolerating Zetia 10 mg.  Initially, she was very hesitant to initiate any therapy for hyperlipidemia but after nutritional guidance despite weight loss with continued LDL elevation she ultimately consented to initiate therapy.  She has tolerated Zetia without side effects. She has normal systolic function and are very faint systolic murmur most likely is due to trivial tricuspid regurgitation which is not significant.  In the past, she had developed chest fluttering episodes.  These have improved with initiation of beta blocker therapy and she is now doing well with Toprol-XL 25 mg daily.  Her blood pressure today is stable on her current dose of Toprol.  She has restless legs and takes Requip with improvement.  She recently has noticed some paresthesias of her hands and feet, which may represent mild peripheral neuropathy.  I reviewed the laboratory with her in detail that she had done by Dr. Tomi Bamberger last September 2017.  I had a long discussion with her concerning development of subclinical atherosclerosis with her current LDL level and reviewed with her recent data which demonstrated potential subclinical atherosclerosis even beginning with LDLs in the 60s.  I have strongly recommended that she reconsider a trial of statin therapy.  She had concerns of possible development of Alzheimer's disease from statins.  I discussed the most recent data.  I will initiate Crestor at 10 mg and she will start taking this 2 times per week for 2 weeks.  If she tolerates this, she will then increase this to 3 times per week for several weeks and then increase to every other day.  In 3 months.   We will recheck a chemistry profile, lipid panel and adjustments to her medical regimen will be made if necessary.  I will see her in 6 months for follow-up office visit.  Time spent: 25 minutes  Troy Sine, MD, [facility]  03/29/2017 7:28 PM

## 2017-03-28 NOTE — Patient Instructions (Signed)
Your physician has recommended you make the following change in your medication:   1.) start new rosuvastatin prescription as directed by Dr Claiborne Billings.  Your physician recommends that you return for lab work in: 3 months fasting.  Your physician wants you to follow-up in: 6 months or sooner if needed. You will receive a reminder letter in the mail two months in advance. If you don't receive a letter, please call our office to schedule the follow-up appointment.

## 2017-04-04 DIAGNOSIS — L720 Epidermal cyst: Secondary | ICD-10-CM | POA: Diagnosis not present

## 2017-04-04 DIAGNOSIS — L812 Freckles: Secondary | ICD-10-CM | POA: Diagnosis not present

## 2017-04-04 DIAGNOSIS — L578 Other skin changes due to chronic exposure to nonionizing radiation: Secondary | ICD-10-CM | POA: Diagnosis not present

## 2017-04-25 ENCOUNTER — Other Ambulatory Visit: Payer: Self-pay | Admitting: Cardiovascular Disease

## 2017-04-25 NOTE — Telephone Encounter (Signed)
Rx request sent to pharmacy.  

## 2017-05-07 ENCOUNTER — Other Ambulatory Visit: Payer: Self-pay | Admitting: Family Medicine

## 2017-06-20 DIAGNOSIS — L820 Inflamed seborrheic keratosis: Secondary | ICD-10-CM | POA: Diagnosis not present

## 2017-06-20 DIAGNOSIS — L821 Other seborrheic keratosis: Secondary | ICD-10-CM | POA: Diagnosis not present

## 2017-06-20 DIAGNOSIS — D1800 Hemangioma unspecified site: Secondary | ICD-10-CM | POA: Diagnosis not present

## 2017-07-26 ENCOUNTER — Telehealth: Payer: Self-pay | Admitting: Family Medicine

## 2017-07-26 NOTE — Telephone Encounter (Signed)
prolia insurance verification was submitted and approved with estimated $0 out of pocket cost. Pt already has a appt schedule with Dr. Tomi Bamberger. Prolia injection added to that visit and noted on her appt notes for that day. Pt was called and made aware. Medication was ordered from [facility] 07/26/2017. Sending back to Dr. Tomi Bamberger to make her aware.

## 2017-07-27 ENCOUNTER — Telehealth: Payer: Self-pay | Admitting: Family Medicine

## 2017-07-27 NOTE — Telephone Encounter (Signed)
Pt called back and rescheduled to have her prolia injection on 09/05/2017. Notes to receive injection at her visit with Dr. Tomi Bamberger on 08/31/2017. Sending back to Dr. Tomi Bamberger to make her aware.

## 2017-07-27 NOTE — Telephone Encounter (Signed)
Left message for pt to call me back. Acceptable guidelines for prolia state that injection must be 6 months and a day from previous injection. Pt is scheduled to come in for Medicare wellness on 08/31/2017. Her last injection was 03/01/2017. Unfortunately pt must be scheduled 09/02/2017 or after. Will advise pt when she calls me back. After pt informed need to schedule pt an appt and will advise Dr. Tomi Bamberger. I did speak to Prolia rep and no wiggle room on injection dates.

## 2017-08-17 ENCOUNTER — Other Ambulatory Visit: Payer: Self-pay | Admitting: Family Medicine

## 2017-08-22 ENCOUNTER — Other Ambulatory Visit: Payer: Self-pay | Admitting: Cardiovascular Disease

## 2017-08-22 NOTE — Telephone Encounter (Signed)
Rx(s) sent to pharmacy electronically.  

## 2017-08-23 DIAGNOSIS — H1789 Other corneal scars and opacities: Secondary | ICD-10-CM | POA: Diagnosis not present

## 2017-08-23 DIAGNOSIS — H524 Presbyopia: Secondary | ICD-10-CM | POA: Diagnosis not present

## 2017-08-23 DIAGNOSIS — H2513 Age-related nuclear cataract, bilateral: Secondary | ICD-10-CM | POA: Diagnosis not present

## 2017-08-23 DIAGNOSIS — H52203 Unspecified astigmatism, bilateral: Secondary | ICD-10-CM | POA: Diagnosis not present

## 2017-08-24 ENCOUNTER — Other Ambulatory Visit: Payer: Medicare Other

## 2017-08-24 DIAGNOSIS — Z5181 Encounter for therapeutic drug level monitoring: Secondary | ICD-10-CM

## 2017-08-24 DIAGNOSIS — E039 Hypothyroidism, unspecified: Secondary | ICD-10-CM

## 2017-08-24 DIAGNOSIS — E7800 Pure hypercholesterolemia, unspecified: Secondary | ICD-10-CM | POA: Diagnosis not present

## 2017-08-24 DIAGNOSIS — R7301 Impaired fasting glucose: Secondary | ICD-10-CM | POA: Diagnosis not present

## 2017-08-25 LAB — LIPID PANEL
CHOL/HDL RATIO: 2.3 (calc) (ref <5.0)
Cholesterol: 148 mg/dL (ref <200)
HDL: 64 mg/dL (ref >50)
LDL CHOLESTEROL (CALC): 67 mg/dL
NON-HDL CHOLESTEROL (CALC): 84 mg/dL (ref <130)
Triglycerides: 90 mg/dL (ref <150)

## 2017-08-25 LAB — COMPREHENSIVE METABOLIC PANEL
AG Ratio: 1.7 (calc) (ref 1.0–2.5)
ALBUMIN MSPROF: 4.2 g/dL (ref 3.6–5.1)
ALKALINE PHOSPHATASE (APISO): 46 U/L (ref 33–130)
ALT: 22 U/L (ref 6–29)
AST: 24 U/L (ref 10–35)
BILIRUBIN TOTAL: 0.6 mg/dL (ref 0.2–1.2)
BUN: 13 mg/dL (ref 7–25)
CALCIUM: 9.2 mg/dL (ref 8.6–10.4)
CO2: 23 mmol/L (ref 20–32)
CREATININE: 0.88 mg/dL (ref 0.50–0.99)
Chloride: 109 mmol/L (ref 98–110)
Globulin: 2.5 g/dL (calc) (ref 1.9–3.7)
Glucose, Bld: 115 mg/dL — ABNORMAL HIGH (ref 65–99)
POTASSIUM: 4.1 mmol/L (ref 3.5–5.3)
Sodium: 139 mmol/L (ref 135–146)
TOTAL PROTEIN: 6.7 g/dL (ref 6.1–8.1)

## 2017-08-25 LAB — TSH: TSH: 2.79 mIU/L (ref 0.40–4.50)

## 2017-08-25 LAB — HEMOGLOBIN A1C
EAG (MMOL/L): 6.3 (calc)
Hgb A1c MFr Bld: 5.6 % of total Hgb (ref <5.7)
MEAN PLASMA GLUCOSE: 114 (calc)

## 2017-08-25 LAB — CBC WITH DIFFERENTIAL/PLATELET
BASOS PCT: 0.9 %
Basophils Absolute: 42 cells/uL (ref 0–200)
EOS PCT: 2.6 %
Eosinophils Absolute: 122 cells/uL (ref 15–500)
HEMATOCRIT: 43.1 % (ref 35.0–45.0)
HEMOGLOBIN: 14.8 g/dL (ref 11.7–15.5)
LYMPHS ABS: 1532 {cells}/uL (ref 850–3900)
MCH: 31.1 pg (ref 27.0–33.0)
MCHC: 34.3 g/dL (ref 32.0–36.0)
MCV: 90.5 fL (ref 80.0–100.0)
MPV: 10 fL (ref 7.5–12.5)
Monocytes Relative: 7.9 %
NEUTROS ABS: 2632 {cells}/uL (ref 1500–7800)
NEUTROS PCT: 56 %
Platelets: 163 10*3/uL (ref 140–400)
RBC: 4.76 10*6/uL (ref 3.80–5.10)
RDW: 12.5 % (ref 11.0–15.0)
Total Lymphocyte: 32.6 %
WBC: 4.7 10*3/uL (ref 3.8–10.8)
WBCMIX: 371 {cells}/uL (ref 200–950)

## 2017-08-30 DIAGNOSIS — G2581 Restless legs syndrome: Secondary | ICD-10-CM | POA: Insufficient documentation

## 2017-08-30 NOTE — Progress Notes (Signed)
Chief Complaint  Patient presents with  . Medicare Wellness    nonfasting (labs already done) AWV/med check plus with pelvic. Apple Watch buzzing her about rapid heartbeat. Brought in old Chunky from 2004.     Stacy Carrillo is a 66 y.o. female who presents for annual wellness visit and follow-up on chronic medical conditions.  She has the following concerns:  Osteoporosis:  She had her first Prolia injection in March, and tolerated without side effects. Some discomfort in her toe joints, unsure if it is related to the medication or not. She is due for another now, but it is 2 days too early--she is scheduled for lab visit next week for injection.  Last DEXA showed: ASSESSMENT: The BMD measured at AP Spine L1-L4 is 0.888 g/cm2 with a T-score of -2.5. This patient is considered osteoporotic according to [facility]) criteria. Site Region Measured Measured WHO Young Adult BMD Date Age Classification T-score AP Spine L1-L4 12/15/2016 65.3 Osteoporosis -2.5 0.888 g/cm2 DualFemur Neck Right 12/15/2016 65.3 Osteopenia -1.6 0.821 g/cm2   Hyperlipidemia:  She is under the care of Dr. Claiborne Billings.  She had been on Zetia, had refused statins.  He convinced her to try Crestor at their last visit in April. She was to start on this slowly--started Crestor at 10 mg 2 times per week for 2 weeks, gradually increasing. She has been taking it daily for about 2 months, and is tolerating it well. She was supposed to have lipids rechecked for him in 3 mos.  She did not, but had labs done prior to this visit, which included lipids. LDL went from 140 on zetia alone, to 67 on Crestor. See labs below.  Dr. Claiborne Billings also has her on toprol XL for palpitations, and is doing well on this. She takes 1/2 tablet at night. Denies palpitations, dizziness, side effect. Over the summer her Apple Watch would pick up tachycardia, rate of 125.  Once went up to 160 when overheated doing yardwork.  Every once  in a while she feels like something jumps/skips in her throat/upper chest, relieved by a cough in a few seconds. 125 while walking around [place] in the heat over the summer.  Hypothyroidism: On generic thyroid medication. Taking it on an empty stomach, separate from other medications and vitamins. Denies any missed doses. No changes in bowels, nails, energy, moods. Hair is about the same (always gets a little drier this time of year).  She has h/o GERD and HH.  She had taken Nexium for a long time (approx 10 years), has been off for about 5 years.  She stopped due to potential concerns with long-term use, not any side effects.  She had changed to Zantac, had done well except sometimes related to a particular food. She had a flare of reflux in January, when she had been eating an orange at bedtime for a week. At her visit in March, we had discussed restarting Nexium for 1-2 months, then try tapering back down to the 150mg  twice daily of Zantac (or 20mg  twice daily of Pepcid). She never went back to taking Zantac. Stopped the nexium after 1-2 months, and hasn't had any recurrence.  Uses Nexium just prn now, hasn't needed in about a month.    Depression and insomnia:  Previously under the care of Dr. Bridgett Larsson, and then Dr. Caprice Beaver.  Currently she is seeing the NP Pauline Good just for med refills.  Not currently getting counseling.  Other than the insomnia, is doing  well overall.  Asking to retry a medication for insomnia. Trouble both falling asleep and staying asleep, mostly staying asleep. Still had sleep issues even when on HRT.  Sleep problems started with her hysterectomy. Melatonin didn't help, asking about patches which contain it.  RLS--controlled with current medication  Immunization History  Administered Date(s) Administered  . Influenza Split 11/26/2012  . Influenza, High Dose Seasonal PF 08/29/2016  . Influenza,inj,Quad PF,6+ Mos 11/14/2013, 08/14/2014, 08/25/2015  . Pneumococcal  Conjugate-13 08/29/2016  . Tdap 07/04/2013  . Zoster 07/04/2013   Last Pap smear: s/p hysterectomy Last mammogram: 12/2016 Last colonoscopy: 01/2016 Dr. Vira Agar in [place] adenoma and internal hemorrhoids Last DEXA: 12/2016 T-2.5 spine Dentist: twice yearly  Ophtho: yearly  Exercise: Walking, swimming, pilates, stationary bike--totals 45 minutes 5x/week.  Uses equipment with pilates, so has some weight-bearing (resistance).  Other doctors caring for patient include: Dentist: Dr. Orlando Penner Ophtho: Dr. Satira Sark Cardiologist: Dr. Claiborne Billings ENT: Dr. Benjamine Mola Psych: Pauline Good, NP (for meds) GI: Dr. Gaylyn Cheers [facility]) Derm: Dr. Nehemiah Massed [facility]) Chiro: Dr. Claude Manges Ortho: Dr. Rhona Raider (for left knee pain--got cortisone shots a few months ago)  Depression screen: negative Fall screen: negative Functional Status Survey: notable for hearing loss, ringing in the ears, sees Dr. Benjamine Mola See full questionnaires in epic  End of Life Discussion:  Patient has a living will and medical power of attorney  Past Medical History:  Diagnosis Date  . Anxiety    GENERALIZED/WILLIAM CHEN MD  . Colon polyps   . Depression panic(dr.william chen)  . Elevated cholesterol   . GERD (gastroesophageal reflux disease) HH per pt EGD nl 2/06  . Hypothyroid   . IBS (irritable bowel syndrome) constipation(dr elliot [place])  . OSA (obstructive sleep apnea) moderate, not using CPAP, aware of risks   2007; sleep study 11/2015 NORMAL  . Osteopenia hx  . RLS (restless legs syndrome)   . Vitamin D deficiency     Past Surgical History:  Procedure Laterality Date  . ABDOMINAL HYSTERECTOMY  1999   and BSO (for benign reasons)  . APPENDECTOMY  1999  . BREAST BIOPSY Left    bx or asp not sure  . COLONOSCOPY  2011   Dr. Vira Agar [facility] [place])  . COLONOSCOPY WITH PROPOFOL N/A 01/20/2016   Procedure: COLONOSCOPY WITH PROPOFOL;  Surgeon: Manya Silvas, MD;  Location:  [facility] ENDOSCOPY;  Service: Endoscopy;  Laterality: N/A;  . ESOPHAGOGASTRODUODENOSCOPY  2009  . TONSILLECTOMY      Social History   Social History  . Marital status: Married    Spouse name: N/A  . Number of children: 1  . Years of education: N/A   Occupational History  . homemaker    Social History Main Topics  . Smoking status: Never Smoker  . Smokeless tobacco: Never Used  . Alcohol use Yes     Comment: socially, 1 glass wine bi-weekly  . Drug use: No  . Sexual activity: Yes    Partners: Male   Other Topics Concern  . Not on file   Social History Narrative   Lives with husband.  Son lives in [place], married    Family History  Problem Relation Age of Onset  . Heart disease Mother   . Cancer Father        stomach and liver  . Sarcoidosis Sister   . Heart disease Brother 44       stents again at age 16  . Hypercholesterolemia Brother   . Depression Brother   .  Breast cancer Cousin        50's  . Diabetes Other        gestational  . Heart disease Maternal Aunt   . Heart disease Paternal Aunt   . Heart disease Paternal Uncle   . Stroke Neg Hx   . Colon cancer Neg Hx     Outpatient Encounter Prescriptions as of 08/31/2017  Medication Sig Note  . cetirizine (ZYRTEC) 10 MG tablet Take 10 mg by mouth daily.   Stacy Carrillo desvenlafaxine (PRISTIQ) 50 MG 24 hr tablet Take 50 mg by mouth daily.     Stacy Carrillo ezetimibe (ZETIA) 10 MG tablet TAKE 1 TABLET BY MOUTH EVERY DAY   . L-Methylfolate (DEPLIN PO) Take by mouth.   . levothyroxine (SYNTHROID, LEVOTHROID) 25 MCG tablet TAKE 1 TABLET (25 MCG TOTAL) BY MOUTH DAILY.   . metoprolol succinate (TOPROL-XL) 25 MG 24 hr tablet Take 1 tablet (25 mg total) by mouth daily. 08/31/2017: Takes 1/2 tablet  . Multiple Vitamin (MULTIVITAMIN) tablet Take 1 tablet by mouth daily. 10/24/2016: Not in the last 2 days  . rOPINIRole (REQUIP) 0.25 MG tablet Take 0.25 mg by mouth at bedtime. Take 1 tab by mouth 1-2 hours before bed time, may increase to 5 tabs  daily    . rosuvastatin (CRESTOR) 10 MG tablet Take 1 tablet (10 mg total) by mouth daily.   Stacy Carrillo esomeprazole (NEXIUM) 40 MG capsule Take 1 capsule (40 mg total) by mouth daily. (Patient not taking: Reported on 08/31/2017) 08/31/2017: Uses only prn  . fluticasone (FLONASE) 50 MCG/ACT nasal spray Place 1 spray into both nostrils daily. 08/31/2017: Uses prn  . LORazepam (ATIVAN) 0.5 MG tablet Take 0.5 mg by mouth as needed.   08/31/2017: Hasn't had any for the last couple of months  . Suvorexant (BELSOMRA) 10 MG TABS Take 10 mg by mouth at bedtime. Gradually titrate up to 15mg  then 20mg  if needed   . [DISCONTINUED] ranitidine (ZANTAC) 150 MG tablet Take 150 mg by mouth 2 (two) times daily.  10/24/2016: Took some over the weekend  . [DISCONTINUED] rOPINIRole (REQUIP) 0.5 MG tablet     Facility-Administered Encounter Medications as of 08/31/2017  Medication  . pneumococcal 23 valent vaccine (PNU-IMMUNE) injection 0.5 mL   (Belsomra was rx'd today, not prior to visit)  Allergies  Allergen Reactions  . Zithromax [Azithromycin] Hives    ROS: The patient denies anorexia, fever, headaches (rare migraines, occasional aura); denies vision changes, ear pain, sore throat, breast concerns, chest pain, palpitations (improved with toprol, see hPIl), dizziness, syncope, dyspnea on exertion, cough, nausea, vomiting, diarrhea, constipation, abdominal pain, melena, hematochezia, hematuria, incontinence, vaginal bleeding, discharge, odor or itch, genital lesions, joint pains, numbness, tingling, weakness, tremor, suspicious skin lesions, abnormal bleeding/bruising, or enlarged lymph nodes.  Some mild ongoing LBP (psoas and gluteus minimus)--overall much improved. This comes and goes. Celebrex prn helps Left knee pain improved after cortisone shot, only rare discomfort. Right shoulder pain is better, only intermittently Some hearing loss and chronic tinnitus (bilateral), unchanged. Vaginal dryness, mild Insomnia per  HPI Some hot flashes; helps to sleep with an ice pack Some shortness of breath hiking at elevation at [place]:  BP 130/80 (BP Location: Left Arm, Patient Position: Sitting, Cuff Size: Normal)   Pulse 80   Ht 5\' 6"  (1.676 m)   Wt 186 lb (84.4 kg)   BMI 30.02 kg/m    Wt Readings from Last 3 Encounters:  08/31/17 186 lb (84.4 kg)  03/28/17 192 lb (87.1 kg)  02/15/17 189 lb 12.8 oz (86.1 kg)    General Appearance:  Alert, cooperative, no distress, appears stated age   Head:  Normocephalic, without obvious abnormality, atraumatic   Eyes:  PERRL, conjunctiva/corneas clear, EOM's intact, fundi benign   Ears:  Normal TM's and external ear canals  Nose:  Nares normal, mucosa normal, no drainage or sinus tenderness   Throat:  Lips, mucosa, and tongue normal; teeth and gums normal   Neck:  Supple, no lymphadenopathy; thyroid: no enlargement/tenderness/nodules; no carotid bruit or JVD   Back:  Spine nontender, no curvature, ROM normal, no CVA tenderness.  Lungs:  Clear to auscultation bilaterally without wheezes, rales or ronchi; respirations unlabored   Chest Wall:  No tenderness or deformity   Heart:  Regular rate and rhythm, S1 and S2 normal, no murmur, rub or gallop   Breast Exam:  No nipple inversion, nipple discharge. No dominant masses or axillary lymphadenopathy.   Abdomen:  Soft, non-tender, nondistended, normoactive bowel sounds, no masses, no hepatosplenomegaly   Genitalia:  normal external genitalia, with mild atrophic changes. Bimanual exam is normal--no appreciable masses. Uterus and ovaries surgically absent.   Rectal: Normal sphincter tone, no masses; heme negative stool  Extremities:  No clubbing, cyanosis or edema   Pulses:  2+ and symmetric all extremities   Skin:  Skin color, texture, turgor normal, no rashes. 3-3mm red nodule below the left knee, unchanged (angioma)  Lymph nodes:  Cervical, supraclavicular, and  axillary nodes normal   Neurologic:  CNII-XII intact, normal strength, sensation and gait; reflexes 2+ and symmetric throughout   Psych:  Normal mood, affect, hygiene and grooming    Chemistry      Component Value Date/Time   NA 139 08/24/2017 1014   K 4.1 08/24/2017 1014   CL 109 08/24/2017 1014   CO2 23 08/24/2017 1014   BUN 13 08/24/2017 1014   CREATININE 0.88 08/24/2017 1014      Component Value Date/Time   CALCIUM 9.2 08/24/2017 1014   ALKPHOS 47 08/25/2016 1037   AST 24 08/24/2017 1014   ALT 22 08/24/2017 1014   BILITOT 0.6 08/24/2017 1014     Fasting glucose 115  Lab Results  Component Value Date   HGBA1C 5.6 08/24/2017   Lab Results  Component Value Date   WBC 4.7 08/24/2017   HGB 14.8 08/24/2017   HCT 43.1 08/24/2017   MCV 90.5 08/24/2017   PLT 163 08/24/2017   Lab Results  Component Value Date   TSH 2.79 08/24/2017   Lab Results  Component Value Date   CHOL 148 08/24/2017   HDL 64 08/24/2017   LDLCALC 140 (H) 08/25/2016   TRIG 90 08/24/2017   CHOLHDL 2.3 08/24/2017   LDL 67 08/24/17   ASSESSMENT/PLAN:  Medicare annual wellness visit, initial  Osteoporosis without current pathological fracture, unspecified osteoporosis type - discussed Ca, D, weight-bearing exercise; Prolia injection next week and q6 mos  Impaired fasting glucose - counseled re: diet, exercise, wt  Pure hypercholesterolemia - significantly improved; tolerating Crestor (and zetia) per Dr. Claiborne Billings. will send labs to him  Gastroesophageal reflux disease, esophagitis presence not specified - improved; only intermittent sx  Need for pneumococcal vaccine - Plan: Pneumococcal polysaccharide vaccine 23-valent greater than or equal to 2yo subcutaneous/IM, DISCONTINUED: pneumococcal 23 valent vaccine (PNU-IMMUNE) injection 0.5 mL  Need for prophylactic vaccination and inoculation against influenza  Hypothyroidism, unspecified type - adequately replaced  Palpitations -  controlled with beta blocker  RLS (restless legs syndrome) - controlled  Need for influenza vaccination - Plan: Flu vaccine HIGH DOSE PF (Fluzone High dose)  Insomnia, unspecified type - re-try Belsomra, starting at 10mg , titrating up to 15 and to 20, if needed. - Plan: Suvorexant (BELSOMRA) 10 MG TABS   Discussed monthly self breast exams and yearly mammograms; at least 30 minutes of aerobic activity at least 5 days/week and weight-bearing exercise 2x/week; proper sunscreen use reviewed; healthy diet, including goals of calcium and vitamin D intake and alcohol recommendations (less than or equal to 1 drink/day) reviewed; regular seatbelt use; changing batteries in smoke detectors.  Immunization recommendations discussed--high dose flu shot and pneumovax given today. Shingrix recommended; risks/benefits/side effects reviewed, to get from pharmacy. Colonoscopy recommendations reviewed--due in 01/2021 (5 years). DEXA due 12/2018.   Full Code, Full Care discussed.  F/u as scheduled next week for Prolia, and then again in 6 mos for next injection F/u 1 year for AWV/med check, sooner prn. Fasting labs prior (orders entered).    Medicare Attestation I have personally reviewed: The patient's medical and social history Their use of alcohol, tobacco or illicit drugs Their current medications and supplements The patient's functional ability including ADLs,fall risks, home safety risks, cognitive, and hearing and visual impairment Diet and physical activities Evidence for depression or mood disorders  The patient's weight, height, and BMI have been recorded in the chart.  I have made referrals, counseling, and provided education to the patient based on review of the above and I have provided the patient with a written personalized care plan for preventive services.

## 2017-08-31 ENCOUNTER — Ambulatory Visit (INDEPENDENT_AMBULATORY_CARE_PROVIDER_SITE_OTHER): Payer: Medicare Other | Admitting: Family Medicine

## 2017-08-31 ENCOUNTER — Encounter: Payer: Self-pay | Admitting: Family Medicine

## 2017-08-31 VITALS — BP 130/80 | HR 80 | Ht 66.0 in | Wt 186.0 lb

## 2017-08-31 DIAGNOSIS — R7301 Impaired fasting glucose: Secondary | ICD-10-CM

## 2017-08-31 DIAGNOSIS — E039 Hypothyroidism, unspecified: Secondary | ICD-10-CM

## 2017-08-31 DIAGNOSIS — Z5181 Encounter for therapeutic drug level monitoring: Secondary | ICD-10-CM | POA: Diagnosis not present

## 2017-08-31 DIAGNOSIS — G2581 Restless legs syndrome: Secondary | ICD-10-CM | POA: Diagnosis not present

## 2017-08-31 DIAGNOSIS — Z23 Encounter for immunization: Secondary | ICD-10-CM

## 2017-08-31 DIAGNOSIS — R002 Palpitations: Secondary | ICD-10-CM | POA: Diagnosis not present

## 2017-08-31 DIAGNOSIS — Z0000 Encounter for general adult medical examination without abnormal findings: Secondary | ICD-10-CM

## 2017-08-31 DIAGNOSIS — M810 Age-related osteoporosis without current pathological fracture: Secondary | ICD-10-CM

## 2017-08-31 DIAGNOSIS — E7800 Pure hypercholesterolemia, unspecified: Secondary | ICD-10-CM

## 2017-08-31 DIAGNOSIS — K219 Gastro-esophageal reflux disease without esophagitis: Secondary | ICD-10-CM | POA: Diagnosis not present

## 2017-08-31 DIAGNOSIS — G4700 Insomnia, unspecified: Secondary | ICD-10-CM | POA: Diagnosis not present

## 2017-08-31 MED ORDER — SUVOREXANT 10 MG PO TABS: 10.0000 mg | ORAL_TABLET | Freq: Every day | ORAL | 0 refills | Status: DC

## 2017-08-31 MED ORDER — PNEUMOCOCCAL VAC POLYVALENT 25 MCG/0.5ML IJ INJ: 0.5000 mL | INJECTION | INTRAMUSCULAR | Status: DC

## 2017-08-31 NOTE — Patient Instructions (Addendum)
HEALTH MAINTENANCE RECOMMENDATIONS:  It is recommended that you get at least 30 minutes of aerobic exercise at least 5 days/week (for weight loss, you may need as much as 60-90 minutes). This can be any activity that gets your heart rate up. This can be divided in 10-15 minute intervals if needed, but try and build up your endurance at least once a week.  Weight bearing exercise is also recommended twice weekly.  Eat a healthy diet with lots of vegetables, fruits and fiber.  "Colorful" foods have a lot of vitamins (ie green vegetables, tomatoes, red peppers, etc).  Limit sweet tea, regular sodas and alcoholic beverages, all of which has a lot of calories and sugar.  Up to 1 alcoholic drink daily may be beneficial for women (unless trying to lose weight, watch sugars).  Drink a lot of water.  Calcium recommendations are 1200-1500 mg daily (1500 mg for postmenopausal women or women without ovaries), and vitamin D 1000 IU daily.  This should be obtained from diet and/or supplements (vitamins), and calcium should not be taken all at once, but in divided doses.  Monthly self breast exams and yearly mammograms for women over the age of 65 is recommended.  Sunscreen of at least SPF 30 should be used on all sun-exposed parts of the skin when outside between the hours of 10 am and 4 pm (not just when at beach or pool, but even with exercise, golf, tennis, and yard work!)  Use a sunscreen that says "broad spectrum" so it covers both UVA and UVB rays, and make sure to reapply every 1-2 hours.  Remember to change the batteries in your smoke detectors when changing your clock times in the spring and fall.  Use your seat belt every time you are in a car, and please drive safely and not be distracted with cell phones and texting while driving.   Stacy Carrillo , Thank you for taking time to come for your Medicare Wellness Visit. I appreciate your ongoing commitment to your health goals. Please review the  following plan we discussed and let me know if I can assist you in the future.   These are the goals we discussed: Goals    None      This is a list of the screening recommended for you and due dates:  Health Maintenance  Topic Date Due  . Flu Shot  07/05/2017  . Pneumonia vaccines (2 of 2 - PPSV23) 08/29/2017  . Mammogram  12/15/2018  . Colon Cancer Screening  01/19/2021  . Tetanus Vaccine  07/05/2023  . DEXA scan (bone density measurement)  Completed  .  Hepatitis C: One time screening is recommended by Center for Disease Control  (CDC) for  adults born from [birth year] through 1965.   Completed   Next bone density test will be due in 12/2018. You got your flu shot and second pneumonia vaccine today  I recommend getting the new shingles vaccine (Shingrix). You will need to check with your insurance to see if it is covered, and if covered by Medicare Part D, you need to get from the pharmacy rather than our office.  It is a series of 2 injections, spaced 2 months apart.  Stacy Carrillo will call in the [facility] for you.  Start at 10mg , take for 5-7 days and if no effect, increase to 1.5 tablet.  If after a week, you still aren't seeing improvement in your sleep (but no side effects) then increase to 20mg  (2 pills) each  night.  Let us know which dose (if any) is effective so that we can change the dose for your next refill.

## 2017-09-03 ENCOUNTER — Other Ambulatory Visit: Payer: Self-pay | Admitting: Family Medicine

## 2017-09-05 ENCOUNTER — Other Ambulatory Visit: Payer: Medicare Other

## 2017-09-07 ENCOUNTER — Encounter: Payer: Self-pay | Admitting: *Deleted

## 2017-09-10 ENCOUNTER — Telehealth: Payer: Self-pay | Admitting: Family Medicine

## 2017-09-10 NOTE — Telephone Encounter (Signed)
P.A. BELSOMRA 

## 2017-09-11 ENCOUNTER — Other Ambulatory Visit: Payer: Medicare Other

## 2017-09-12 ENCOUNTER — Other Ambulatory Visit (INDEPENDENT_AMBULATORY_CARE_PROVIDER_SITE_OTHER): Payer: Medicare Other

## 2017-09-12 DIAGNOSIS — M810 Age-related osteoporosis without current pathological fracture: Secondary | ICD-10-CM

## 2017-09-12 NOTE — Telephone Encounter (Signed)
P.A. Isabelle Course, stating pt also needs trial of Temazepam or Silenor.  Pt came by the office & I informed and she states she has been on Tamezepam before prescribed by Dr. Ulis Rias & it didn't help her.  Advised I would resubmit P.A. With new info.  Also she is going out of town for next few days and asked if we had samples to help her until P.A./ appeal  Ok per Dr. Redmond [employer] #6 samples 10mg  Belsomra given to pt.

## 2017-09-13 DIAGNOSIS — M810 Age-related osteoporosis without current pathological fracture: Secondary | ICD-10-CM | POA: Diagnosis not present

## 2017-09-13 MED ORDER — DENOSUMAB 60 MG/ML ~~LOC~~ SOLN
60.0000 mg | Freq: Once | SUBCUTANEOUS | Status: AC
  Administered 2017-09-13: 60 mg via SUBCUTANEOUS

## 2017-09-21 ENCOUNTER — Encounter: Payer: Self-pay | Admitting: Family Medicine

## 2017-09-21 NOTE — Telephone Encounter (Signed)
Called [employer] due to no response from last P.A. And was informed an appeal was needed.  Appeal letter was typed and faxed to T# [phone]

## 2017-09-22 ENCOUNTER — Other Ambulatory Visit: Payer: Self-pay | Admitting: Cardiovascular Disease

## 2017-09-28 NOTE — Telephone Encounter (Signed)
Appeal approved til 09/10/18

## 2017-09-29 NOTE — Telephone Encounter (Signed)
Called pt informed

## 2017-11-15 DIAGNOSIS — H903 Sensorineural hearing loss, bilateral: Secondary | ICD-10-CM | POA: Diagnosis not present

## 2017-11-15 DIAGNOSIS — H838X3 Other specified diseases of inner ear, bilateral: Secondary | ICD-10-CM | POA: Diagnosis not present

## 2017-11-15 DIAGNOSIS — H9313 Tinnitus, bilateral: Secondary | ICD-10-CM | POA: Diagnosis not present

## 2017-11-24 ENCOUNTER — Other Ambulatory Visit: Payer: Self-pay | Admitting: Cardiovascular Disease

## 2017-12-11 ENCOUNTER — Encounter: Payer: Self-pay | Admitting: Physician Assistant

## 2017-12-11 ENCOUNTER — Ambulatory Visit (INDEPENDENT_AMBULATORY_CARE_PROVIDER_SITE_OTHER): Payer: Medicare Other | Admitting: Physician Assistant

## 2017-12-11 VITALS — BP 106/74 | HR 82 | Ht 66.0 in | Wt 193.0 lb

## 2017-12-11 DIAGNOSIS — E785 Hyperlipidemia, unspecified: Secondary | ICD-10-CM

## 2017-12-11 DIAGNOSIS — R55 Syncope and collapse: Secondary | ICD-10-CM

## 2017-12-11 DIAGNOSIS — R002 Palpitations: Secondary | ICD-10-CM | POA: Diagnosis not present

## 2017-12-11 DIAGNOSIS — E039 Hypothyroidism, unspecified: Secondary | ICD-10-CM | POA: Diagnosis not present

## 2017-12-11 NOTE — Patient Instructions (Signed)
Medication Instructions:  Your physician recommends that you continue on your current medications as directed. Please refer to the Current Medication list given to you today.  Labwork: None   Testing/Procedures: Your physician has recommended that you wear an 30 Day event monitor. Event monitors are medical devices that record the heart's electrical activity. Doctors most often [place] these monitors to diagnose arrhythmias. Arrhythmias are problems with the speed or rhythm of the heartbeat. The monitor is a small, portable device. You can wear one while you do your normal daily activities. This is usually used to diagnose what is causing palpitations/syncope (passing out).  This monitor will be put on at our [facility] the address is [place] wants you to follow-up in: 6 months with Dr Claiborne Billings. You will receive a reminder letter in the mail two months in advance. If you don't receive a letter, please call our office to schedule the follow-up appointment.  Any Other Special Instructions Will Be Listed Below (If Applicable).  If you need a refill on your cardiac medications before your next appointment, please call your pharmacy.

## 2017-12-11 NOTE — Progress Notes (Signed)
Cardiology Office Note    Date:  12/13/2017   ID:  Stacy Carrillo, DOB Jun 08, 1951, MRN [id number]  PCP:  Stacy Ohara, MD  Cardiologist:  Dr. Claiborne Carrillo    Chief Complaint  Patient presents with  . Follow-up    seen for Dr. Claiborne Carrillo, palpitation, presyncope  . Headache    2 weeks ago.    History of Present Illness:  Stacy Carrillo is a 67 y.o. female with PMH of HLD, hypothyroidism, GERD, RLS and significant FHx of CAD. She was started on Zetia in 2013. Last echo on 08/29/2012 showed normal EF, no significant valvular issue.  She had a sleep study in December 2016, she was told that she did not have obstructive sleep apnea.  Patient was last seen by Dr. Claiborne Carrillo on 03/28/2017, Crestor 10 mg was started, initiated at twice a week, later would increase to three times a week.  Last lipid panel obtained on 08/24/2017 showed total cholesterol 148, HDL 64, triglyceride 90, LDL 67.  Patient presents today for cardiology office visit.  She denies any significant chest pain or shortness of breath.  She is quite active.  Recent lab work shows a very well controlled cholesterol.  Her only issue seems to be occasional palpitation that is occurring once a month, 2 weeks ago she was standing in the shower when she had a presyncopal episode.  She denies any feeling of tachycardia palpitation.  The symptom was concerning for either PVCs, significant bradycardia or prolonged sinus pause, we eventually agreed to proceed with a 30-day event monitor.  Otherwise she has no heart failure symptoms, no lower extremity edema, orthopnea or PND.   Past Medical History:  Diagnosis Date  . Anxiety    GENERALIZED/WILLIAM CHEN MD  . Colon polyps   . Depression panic(dr.[email or web address] chen)  . Elevated cholesterol   . GERD (gastroesophageal reflux disease) HH per pt EGD nl 2/06  . Hypothyroid   . IBS (irritable bowel syndrome) constipation(dr elliot Buckland)  . OSA (obstructive sleep apnea) moderate, not using CPAP, aware of  risks   2007; sleep study 11/2015 NORMAL  . Osteopenia hx  . RLS (restless legs syndrome)   . Vitamin D deficiency     Past Surgical History:  Procedure Laterality Date  . ABDOMINAL HYSTERECTOMY  1999   and BSO (for benign reasons)  . APPENDECTOMY  1999  . BREAST BIOPSY Left    bx or asp not sure  . COLONOSCOPY  2011   Dr. Vira Agar [facility] [place])  . COLONOSCOPY WITH PROPOFOL N/A 01/20/2016   Procedure: COLONOSCOPY WITH PROPOFOL;  Surgeon: Manya Silvas, MD;  Location: [facility] ENDOSCOPY;  Service: Endoscopy;  Laterality: N/A;  . ESOPHAGOGASTRODUODENOSCOPY  2009  . TONSILLECTOMY      Current Medications: Outpatient Medications Prior to Visit  Medication Sig Dispense Refill  . cetirizine (ZYRTEC) 10 MG tablet Take 10 mg by mouth daily.    Marland Kitchen desvenlafaxine (PRISTIQ) 50 MG 24 hr tablet Take 50 mg by mouth daily.      Marland Kitchen esomeprazole (NEXIUM) 40 MG capsule Take 1 capsule (40 mg total) by mouth daily. 30 capsule 3  . ezetimibe (ZETIA) 10 MG tablet TAKE 1 TABLET BY MOUTH EVERY DAY 30 tablet 6  . fluticasone (FLONASE) 50 MCG/ACT nasal spray Place 1 spray into both nostrils daily.    Marland Kitchen L-Methylfolate (DEPLIN PO) Take by mouth.    . levothyroxine (SYNTHROID, LEVOTHROID) 25 MCG tablet TAKE 1 TABLET EVERY DAY 90 tablet 3  .  LORazepam (ATIVAN) 0.5 MG tablet Take 0.5 mg by mouth as needed.      . metoprolol succinate (TOPROL-XL) 25 MG 24 hr tablet TAKE 1 TABLET BY MOUTH EVERY DAY 30 tablet 0  . Multiple Vitamin (MULTIVITAMIN) tablet Take 1 tablet by mouth daily.    Marland Kitchen rOPINIRole (REQUIP) 0.25 MG tablet Take 0.25 mg by mouth at bedtime. Take 1 tab by mouth 1-2 hours before bed time, may increase to 5 tabs daily     . rosuvastatin (CRESTOR) 10 MG tablet Take 1 tablet (10 mg total) by mouth daily. 30 tablet 3  . Suvorexant (BELSOMRA) 10 MG TABS Take 10 mg by mouth at bedtime. Gradually titrate up to 15mg  then 20mg  if needed 30 tablet 0   No facility-administered medications prior to  visit.      Allergies:   Zithromax [azithromycin]   Social History   Socioeconomic History  . Marital status: Married    Spouse name: None  . Number of children: 1  . Years of education: None  . Highest education level: None  Social Needs  . Financial resource strain: None  . Food insecurity - worry: None  . Food insecurity - inability: None  . Transportation needs - medical: None  . Transportation needs - non-medical: None  Occupational History  . Occupation: homemaker  Tobacco Use  . Smoking status: Never Smoker  . Smokeless tobacco: Never Used  Substance and Sexual Activity  . Alcohol use: Yes    Comment: socially, 1 glass wine bi-weekly  . Drug use: No  . Sexual activity: Yes    Partners: Male  Other Topics Concern  . None  Social History Narrative   Lives with husband.  Son lives in [place], married     Family History:  The patient's \ family history includes Breast cancer in her cousin; Cancer in her father; Depression in her brother; Diabetes in her other; Heart disease in her maternal aunt, mother, paternal aunt, and paternal uncle; Heart disease (age of onset: 27) in her brother; Hypercholesterolemia in her brother; Sarcoidosis in her sister.   ROS:   Please see the history of present illness.    ROS All other systems reviewed and are negative.   PHYSICAL EXAM:   VS:  BP 106/74   Pulse 82   Ht 5\' 6"  (1.676 m)   Wt 193 lb (87.5 kg)   BMI 31.15 kg/m    GEN: Well nourished, well developed, in no acute distress  HEENT: normal  Neck: no JVD, carotid bruits, or masses Cardiac: RRR; no murmurs, rubs, or gallops,no edema  Respiratory:  clear to auscultation bilaterally, normal work of breathing GI: soft, nontender, nondistended, + BS MS: no deformity or atrophy  Skin: warm and dry, no rash Neuro:  Alert and Oriented x 3, Strength and sensation are intact Psych: euthymic mood, full affect  Wt Readings from Last 3 Encounters:  12/11/17 193 lb (87.5 kg)    08/31/17 186 lb (84.4 kg)  03/28/17 192 lb (87.1 kg)      Studies/Labs Reviewed:   EKG:  EKG is not ordered today.   Recent Labs: 08/24/2017: ALT 22; BUN 13; Creat 0.88; Hemoglobin 14.8; Platelets 163; Potassium 4.1; Sodium 139; TSH 2.79   Lipid Panel    Component Value Date/Time   CHOL 148 08/24/2017 1014   CHOL 222 (H) 05/09/2013 1117   TRIG 90 08/24/2017 1014   TRIG 70 05/09/2013 1117   HDL 64 08/24/2017 1014   HDL 71  05/09/2013 1117   CHOLHDL 2.3 08/24/2017 1014   VLDL 20 08/25/2016 1037   LDLCALC 140 (H) 08/25/2016 1037   LDLCALC 137 (H) 05/09/2013 1117    Additional studies/ records that were reviewed today include:   Echo 08/29/2012     ASSESSMENT:    1. Pre-syncope   2. Palpitation   3. Hyperlipidemia, unspecified hyperlipidemia type   4. Hypothyroidism, unspecified type      PLAN:  In order of problems listed above:  1. Presyncope: Patient has a history of palpitation, however she continued to have occasional dizziness once a month.  2 weeks ago while she was standing in the shower she had a episode of presyncope.  I recommended a 30-day event monitor to rule out significant arrhythmia.  2. Hyperlipidemia: On Zetia and Crestor 10 mg daily.  Recent lipid panel showed a very well controlled LDL  3. Hypothyroidism: On Synthroid, managed by primary care provider.    Medication Adjustments/Labs and Tests Ordered: Current medicines are reviewed at length with the patient today.  Concerns regarding medicines are outlined above.  Medication changes, Labs and Tests ordered today are listed in the Patient Instructions below. Patient Instructions  Medication Instructions:  Your physician recommends that you continue on your current medications as directed. Please refer to the Current Medication list given to you today.  Labwork: None   Testing/Procedures: Your physician has recommended that you wear an 30 Day event monitor. Event monitors are medical  devices that record the heart's electrical activity. Doctors most often [place] these monitors to diagnose arrhythmias. Arrhythmias are problems with the speed or rhythm of the heartbeat. The monitor is a small, portable device. You can wear one while you do your normal daily activities. This is usually used to diagnose what is causing palpitations/syncope (passing out).  This monitor will be put on at our [facility] the address is [place] wants you to follow-up in: 6 months with Dr Stacy Carrillo. You will receive a reminder letter in the mail two months in advance. If you don't receive a letter, please call our office to schedule the follow-up appointment.  Any Other Special Instructions Will Be Listed Below (If Applicable).  If you need a refill on your cardiac medications before your next appointment, please call your pharmacy.     Hilbert Corrigan, [place]  12/13/2017 6:36 AM    [address], [place], [place]  [address] Phone: [phone]; Fax: [phone]

## 2017-12-13 ENCOUNTER — Encounter: Payer: Self-pay | Admitting: Physician Assistant

## 2017-12-21 ENCOUNTER — Other Ambulatory Visit: Payer: Self-pay | Admitting: Cardiovascular Disease

## 2017-12-23 ENCOUNTER — Other Ambulatory Visit: Payer: Self-pay | Admitting: Cardiovascular Disease

## 2017-12-27 ENCOUNTER — Ambulatory Visit (INDEPENDENT_AMBULATORY_CARE_PROVIDER_SITE_OTHER): Payer: Medicare Other

## 2017-12-27 DIAGNOSIS — R55 Syncope and collapse: Secondary | ICD-10-CM | POA: Diagnosis not present

## 2017-12-27 DIAGNOSIS — R002 Palpitations: Secondary | ICD-10-CM

## 2018-01-15 ENCOUNTER — Other Ambulatory Visit: Payer: Self-pay | Admitting: Family Medicine

## 2018-01-15 DIAGNOSIS — Z1231 Encounter for screening mammogram for malignant neoplasm of breast: Secondary | ICD-10-CM

## 2018-01-24 ENCOUNTER — Other Ambulatory Visit: Payer: Self-pay | Admitting: Cardiovascular Disease

## 2018-01-24 NOTE — Telephone Encounter (Signed)
REFILL 

## 2018-02-05 ENCOUNTER — Encounter: Payer: Self-pay | Admitting: Family Medicine

## 2018-02-05 ENCOUNTER — Ambulatory Visit (INDEPENDENT_AMBULATORY_CARE_PROVIDER_SITE_OTHER): Payer: Medicare Other | Admitting: Family Medicine

## 2018-02-05 VITALS — BP 130/82 | HR 80 | Temp 97.0°F | Wt 195.4 lb

## 2018-02-05 DIAGNOSIS — H1032 Unspecified acute conjunctivitis, left eye: Secondary | ICD-10-CM | POA: Diagnosis not present

## 2018-02-05 MED ORDER — POLYMYXIN B-TRIMETHOPRIM 10000-0.1 UNIT/ML-% OP SOLN: 1.0000 [drp] | OPHTHALMIC | 0 refills | Status: DC

## 2018-02-05 NOTE — Patient Instructions (Signed)
Use the eye drops as prescribed and follow up with [place] or your eye doctor if any worsening symptoms or if not improving in the next 2-3 days.    Bacterial Conjunctivitis Bacterial conjunctivitis is an infection of your conjunctiva. This is the clear membrane that covers the white part of your eye and the inner surface of your eyelid. This condition can make your eye:  Red or pink.  Itchy.  This condition is caused by bacteria. This condition spreads very easily from person to person (is contagious) and from one eye to the other eye. Follow these instructions at home: Medicines  Take or apply your antibiotic medicine as told by your doctor. Do not stop taking or applying the antibiotic even if you start to feel better.  Take or apply over-the-counter and prescription medicines only as told by your doctor.  Do not touch your eyelid with the eye drop bottle or the ointment tube. Managing discomfort  Wipe any fluid from your eye with a warm, wet washcloth or a cotton ball.  Place a cool, clean washcloth on your eye. Do this for 10-20 minutes, 3-4 times per day. General instructions  Do not wear contact lenses until the irritation is gone. Wear glasses until your doctor says it is okay to wear contacts.  Do not wear eye makeup until your symptoms are gone. Throw away any old makeup.  Change or wash your pillowcase every day.  Do not share towels or washcloths with anyone.  Wash your hands often with soap and water. Use paper towels to dry your hands.  Do not touch or rub your eyes.  Do not drive or use heavy machinery if your vision is blurry. Contact a doctor if:  You have a fever.  Your symptoms do not get better after 10 days. Get help right away if:  You have a fever and your symptoms suddenly get worse.  You have very bad pain when you move your eye.  Your face: ? Hurts. ? Is red. ? Is swollen.  You have sudden loss of vision. This information is not intended to  replace advice given to you by your health care provider. Make sure you discuss any questions you have with your health care provider. Document Released: 08/30/2008 Document Revised: 04/28/2016 Document Reviewed: 09/03/2015 Elsevier Interactive Patient Education  [employer].

## 2018-02-05 NOTE — Progress Notes (Signed)
   Subjective:    Patient ID: Stacy Carrillo, female    DOB: 1950/12/14, 67 y.o.   MRN: [id number]  HPI Chief Complaint  Patient presents with  . eye    yesterday- felt like sandpaper, swollen, red, swollen shut this morning. having sinus issues going on   She is here with complaints of a 1 day history of left eye redness, eyelid edema, itching, drainage and matting this morning. Denies injury.   No eye pain or foreign body sensation.  States swelling and drainage have improved throughout the day. Reports having yellowish drainage last evening and this morning her left eyelids were matted together.  Denies fever, chills, headache, blurred or double vision. No N/V/D.  She was at an athletic event over the weekend and around thousands of people.   Reviewed allergies, medications, past medical, surgical, family, and social history.    Review of Systems Pertinent positives and negatives in the history of present illness.     Objective:   Physical Exam  Constitutional: She appears well-developed and well-nourished. No distress.  HENT:  Nose: Nose normal. Right sinus exhibits no maxillary sinus tenderness and no frontal sinus tenderness. Left sinus exhibits no maxillary sinus tenderness and no frontal sinus tenderness.  Mouth/Throat: Oropharynx is clear and moist.  Eyes: EOM are normal. Pupils are equal, round, and reactive to light. Left eye exhibits discharge. Left conjunctiva is injected. Left conjunctiva has no hemorrhage.  Left upper lid with mild edema, clear and yellowish drainage to inner canthi, injected conjunctiva.   Lymphadenopathy:    She has no cervical adenopathy.   BP 130/82   Pulse 80   Temp (!) 97 F (36.1 C) (Oral)   Wt 195 lb 6.4 oz (88.6 kg)   SpO2 99%   BMI 31.54 kg/m       Assessment & Plan:  Acute conjunctivitis of left eye, unspecified acute conjunctivitis type - Plan: trimethoprim-polymyxin b (POLYTRIM) ophthalmic solution  Presumed bacterial  infection. Offered erythromycin ointment vs drops and she prefers drops. Polytrim prescribed. Advised to throw away eye make up and discussed contagious nature. May use warm or cool compresses. She will follow up if not improving or if she noticing any new or worsening symptoms.

## 2018-02-07 ENCOUNTER — Telehealth: Payer: Self-pay | Admitting: *Deleted

## 2018-02-07 NOTE — Telephone Encounter (Signed)
The eyedrops can burn.  It may take up to a week before she notices significant improvement.  As long as she is not getting worse then she should be okay.

## 2018-02-07 NOTE — Telephone Encounter (Signed)
Patient called, she saw you Monday and was diagnosed with conjunctivitis. Still having itching, redness and drainage-only slight improvement. Wasn't sure of time frame for improvement, should she being seeing more of an improvement? Will have to go out of town in the next few day as her mother in law may pass. Should she purchase an eye patch, and if so, where? How long should she use the eye drops? How long is she contagious for? And lastly, are these eye drops supposed to burn? Please advise, thanks.

## 2018-02-07 NOTE — Telephone Encounter (Signed)
Patient advised.

## 2018-02-07 NOTE — Telephone Encounter (Signed)
Called patient back and she said that you had mentioned a cream for her eye rather than the drops, would this work quicker/better? Also should she use the drops preventatively in her other eye? And would oral abx be helpful in addition to the drops?

## 2018-02-07 NOTE — Telephone Encounter (Signed)
We can switch her to the erythromycin ointment if she would prefer.  I cannot tell her for sure if this would work better.  Again, as long as she is not getting worse then she can give the drops a couple of more days.  Oral antibiotics would not be appropriate right now.

## 2018-02-15 ENCOUNTER — Ambulatory Visit
Admission: RE | Admit: 2018-02-15 | Discharge: 2018-02-15 | Disposition: A | Discharge status: Home / Self Care | Payer: Medicare Other | Source: Ambulatory Visit | Attending: Family Medicine | Admitting: Family Medicine

## 2018-02-15 DIAGNOSIS — Z1231 Encounter for screening mammogram for malignant neoplasm of breast: Secondary | ICD-10-CM | POA: Insufficient documentation

## 2018-02-16 ENCOUNTER — Encounter: Payer: Self-pay | Admitting: Family Medicine

## 2018-02-19 ENCOUNTER — Encounter: Payer: Self-pay | Admitting: Cardiovascular Disease

## 2018-02-19 ENCOUNTER — Other Ambulatory Visit: Payer: Self-pay | Admitting: Family Medicine

## 2018-02-19 DIAGNOSIS — R928 Other abnormal and inconclusive findings on diagnostic imaging of breast: Secondary | ICD-10-CM

## 2018-02-19 DIAGNOSIS — N6489 Other specified disorders of breast: Secondary | ICD-10-CM

## 2018-02-23 ENCOUNTER — Ambulatory Visit
Admission: RE | Admit: 2018-02-23 | Discharge: 2018-02-23 | Disposition: A | Discharge status: Home / Self Care | Payer: Medicare Other | Source: Ambulatory Visit | Attending: Family Medicine | Admitting: Family Medicine

## 2018-02-23 DIAGNOSIS — R928 Other abnormal and inconclusive findings on diagnostic imaging of breast: Secondary | ICD-10-CM | POA: Diagnosis not present

## 2018-02-23 DIAGNOSIS — N6489 Other specified disorders of breast: Secondary | ICD-10-CM

## 2018-02-26 ENCOUNTER — Telehealth: Payer: Self-pay | Admitting: Family Medicine

## 2018-02-26 NOTE — Telephone Encounter (Signed)
Left message for pt to call. Pt needs to schedule Prolia injection for after 03/13/2018. Her estimates out of pocket cost will be $0. Please advise Juliann Pulse if appt is made by someone else.

## 2018-03-14 ENCOUNTER — Other Ambulatory Visit: Payer: Self-pay | Admitting: Cardiovascular Disease

## 2018-03-19 ENCOUNTER — Other Ambulatory Visit: Payer: Medicare Other

## 2018-03-21 ENCOUNTER — Telehealth: Payer: Self-pay | Admitting: Family Medicine

## 2018-03-21 NOTE — Telephone Encounter (Signed)
Left message for pt to call. Needs to reschedule her prolia injection. Pt cancelled via mychart.

## 2018-03-22 ENCOUNTER — Other Ambulatory Visit: Payer: Self-pay

## 2018-03-29 ENCOUNTER — Other Ambulatory Visit: Payer: Self-pay

## 2018-03-29 ENCOUNTER — Other Ambulatory Visit: Payer: Medicare Other

## 2018-03-29 DIAGNOSIS — M810 Age-related osteoporosis without current pathological fracture: Secondary | ICD-10-CM

## 2018-03-29 MED ORDER — DENOSUMAB 60 MG/ML ~~LOC~~ SOSY
60.0000 mg | PREFILLED_SYRINGE | Freq: Once | SUBCUTANEOUS | Status: AC
  Administered 2018-03-29: 60 mg via SUBCUTANEOUS

## 2018-05-15 DIAGNOSIS — L570 Actinic keratosis: Secondary | ICD-10-CM | POA: Diagnosis not present

## 2018-05-15 DIAGNOSIS — L578 Other skin changes due to chronic exposure to nonionizing radiation: Secondary | ICD-10-CM | POA: Diagnosis not present

## 2018-05-15 DIAGNOSIS — L820 Inflamed seborrheic keratosis: Secondary | ICD-10-CM | POA: Diagnosis not present

## 2018-05-29 DIAGNOSIS — F331 Major depressive disorder, recurrent, moderate: Secondary | ICD-10-CM | POA: Diagnosis not present

## 2018-06-04 DIAGNOSIS — S52133A Displaced fracture of neck of unspecified radius, initial encounter for closed fracture: Secondary | ICD-10-CM

## 2018-06-04 HISTORY — DX: Displaced fracture of neck of unspecified radius, initial encounter for closed fracture: S52.133A

## 2018-06-25 DIAGNOSIS — S52135A Nondisplaced fracture of neck of left radius, initial encounter for closed fracture: Secondary | ICD-10-CM | POA: Diagnosis not present

## 2018-06-25 DIAGNOSIS — M25522 Pain in left elbow: Secondary | ICD-10-CM | POA: Diagnosis not present

## 2018-06-28 ENCOUNTER — Encounter: Payer: Self-pay | Admitting: Family Medicine

## 2018-07-17 DIAGNOSIS — M25522 Pain in left elbow: Secondary | ICD-10-CM | POA: Diagnosis not present

## 2018-07-17 DIAGNOSIS — S52135A Nondisplaced fracture of neck of left radius, initial encounter for closed fracture: Secondary | ICD-10-CM | POA: Diagnosis not present

## 2018-07-18 ENCOUNTER — Ambulatory Visit: Payer: Medicare Other | Admitting: Family Medicine

## 2018-07-19 ENCOUNTER — Other Ambulatory Visit: Payer: Self-pay | Admitting: Cardiovascular Disease

## 2018-08-15 ENCOUNTER — Other Ambulatory Visit: Payer: Self-pay | Admitting: Family Medicine

## 2018-08-20 DIAGNOSIS — Z23 Encounter for immunization: Secondary | ICD-10-CM | POA: Diagnosis not present

## 2018-08-28 DIAGNOSIS — H524 Presbyopia: Secondary | ICD-10-CM | POA: Diagnosis not present

## 2018-08-28 DIAGNOSIS — H5203 Hypermetropia, bilateral: Secondary | ICD-10-CM | POA: Diagnosis not present

## 2018-08-28 DIAGNOSIS — H2513 Age-related nuclear cataract, bilateral: Secondary | ICD-10-CM | POA: Diagnosis not present

## 2018-08-28 DIAGNOSIS — H1789 Other corneal scars and opacities: Secondary | ICD-10-CM | POA: Diagnosis not present

## 2018-08-29 ENCOUNTER — Encounter

## 2018-08-30 ENCOUNTER — Other Ambulatory Visit: Payer: Medicare Other

## 2018-08-31 ENCOUNTER — Other Ambulatory Visit: Payer: Medicare Other

## 2018-08-31 DIAGNOSIS — E039 Hypothyroidism, unspecified: Secondary | ICD-10-CM | POA: Diagnosis not present

## 2018-08-31 DIAGNOSIS — Z5181 Encounter for therapeutic drug level monitoring: Secondary | ICD-10-CM

## 2018-08-31 DIAGNOSIS — E7800 Pure hypercholesterolemia, unspecified: Secondary | ICD-10-CM

## 2018-08-31 DIAGNOSIS — R7301 Impaired fasting glucose: Secondary | ICD-10-CM | POA: Diagnosis not present

## 2018-08-31 NOTE — Addendum Note (Signed)
Addended by: Gwinda Maine on: 08/31/2018 10:01 AM   Modules accepted: Orders

## 2018-09-03 LAB — COMPREHENSIVE METABOLIC PANEL
A/G RATIO: 1.7 (ref 1.2–2.2)
ALBUMIN: 4.3 g/dL (ref 3.6–4.8)
ALT: 20 IU/L (ref 0–32)
AST: 23 IU/L (ref 0–40)
Alkaline Phosphatase: 47 IU/L (ref 39–117)
BUN / CREAT RATIO: 12 (ref 12–28)
BUN: 13 mg/dL (ref 8–27)
Bilirubin Total: 0.3 mg/dL (ref 0.0–1.2)
CALCIUM: 9.2 mg/dL (ref 8.7–10.3)
CO2: 24 mmol/L (ref 20–29)
Chloride: 107 mmol/L — ABNORMAL HIGH (ref 96–106)
Creatinine, Ser: 1.08 mg/dL — ABNORMAL HIGH (ref 0.57–1.00)
GFR calc Af Amer: 61 mL/min/{1.73_m2} (ref >59)
GFR, EST NON AFRICAN AMERICAN: 53 mL/min/{1.73_m2} — AB (ref >59)
GLOBULIN, TOTAL: 2.6 g/dL (ref 1.5–4.5)
Glucose: 111 mg/dL — ABNORMAL HIGH (ref 65–99)
POTASSIUM: 4.7 mmol/L (ref 3.5–5.2)
SODIUM: 144 mmol/L (ref 134–144)
Total Protein: 6.9 g/dL (ref 6.0–8.5)

## 2018-09-03 LAB — CBC WITH DIFFERENTIAL/PLATELET
BASOS ABS: 0 10*3/uL (ref 0.0–0.2)
BASOS: 1 %
EOS (ABSOLUTE): 0.1 10*3/uL (ref 0.0–0.4)
Eos: 3 %
Hematocrit: 42.7 % (ref 34.0–46.6)
Hemoglobin: 14.1 g/dL (ref 11.1–15.9)
IMMATURE GRANS (ABS): 0 10*3/uL (ref 0.0–0.1)
Immature Granulocytes: 0 %
LYMPHS ABS: 1.6 10*3/uL (ref 0.7–3.1)
LYMPHS: 34 %
MCH: 30.7 pg (ref 26.6–33.0)
MCHC: 33 g/dL (ref 31.5–35.7)
MCV: 93 fL (ref 79–97)
MONOS ABS: 0.5 10*3/uL (ref 0.1–0.9)
Monocytes: 11 %
NEUTROS ABS: 2.4 10*3/uL (ref 1.4–7.0)
Neutrophils: 51 %
PLATELETS: 255 10*3/uL (ref 150–450)
RBC: 4.59 x10E6/uL (ref 3.77–5.28)
RDW: 13.3 % (ref 12.3–15.4)
WBC: 4.6 10*3/uL (ref 3.4–10.8)

## 2018-09-03 LAB — TSH: TSH: 3.39 u[IU]/mL (ref 0.450–4.500)

## 2018-09-03 LAB — LIPID PANEL
Chol/HDL Ratio: 2.4 ratio (ref 0.0–4.4)
Cholesterol, Total: 150 mg/dL (ref 100–199)
HDL: 63 mg/dL (ref >39)
LDL Calculated: 69 mg/dL (ref 0–99)
Triglycerides: 88 mg/dL (ref 0–149)
VLDL CHOLESTEROL CAL: 18 mg/dL (ref 5–40)

## 2018-09-03 LAB — HEMOGLOBIN A1C
ESTIMATED AVERAGE GLUCOSE: 120 mg/dL
HEMOGLOBIN A1C: 5.8 % — AB (ref 4.8–5.6)

## 2018-09-05 NOTE — Progress Notes (Signed)
Chief Complaint  Patient presents with  . Medicare Wellness    nonfasting AWV with pelvic. Has a place on her left foot she would like you to look at. Right knee has been bothering her for 2 weeks. Has noticed that her ankles have swollen more this summer.     Stacy Carrillo is a 67 y.o. female who presents for annual wellness visit and follow-up on chronic medical conditions.  She has the following concerns:  R knee has been hurting x 2 weeks, sometimes also has pain on the left.  She was moving furniture, and it was swollen, gradually improving. Denies locking or giving way, just sometimes feels weak if she steps awkwardly.  She fell on L arm while in [place], sustaining a radial neck fracture. She has been seeing Dr. Berenice Primas.  Osteoporosis:  She has been getting Prolia injections since March, 2018, tolerating without side effects. Last injection was 4/25, due again later this month. Last DEXA in 12/2016 showed: ASSESSMENT: The BMD measured at AP Spine L1-L4 is 0.888 g/cm2 with a T-score of -2.5. This patient is considered osteoporotic according to [facility]) criteria. Site Region Measured Measured WHO Young Adult BMD Date Age Classification T-score AP Spine L1-L4 12/15/2016 65.3 Osteoporosis -2.5 0.888 g/cm2 DualFemur Neck Right 12/15/2016 65.3 Osteopenia -1.6 0.821 g/cm2   Hyperlipidemia:  She is under the care of Dr. Claiborne Billings, has an appointment next week.  She had been on Zetia, had refused statins but finally put on Crestor in April, 2018. LDL went from 140 on zetia alone, to 67 on Crestor. She had labs prior to today's visit, see below. She denies side effects from the statin.  Dr. Claiborne Billings also has her on toprol XL for palpitations. She takes 1/2 tablet at night; she wonders if she still needs this. Denies palpitations, dizziness, side effect. Every once in a while she feels like something jumps/skips in her throat/upper chest, relieved by a cough in a  few seconds.  Hypothyroidism: On generic thyroid medication. Taking it on an empty stomach, separate from other medications and vitamins. Denies any missed doses. No changes in bowels, hair/skin/nails, energy, moods. +weight gain.  She has h/o GERD and HH. She had taken Nexium for a long time (approx 10 years), had been off for over 5 years. She stopped due to potential concerns with long-term use, not any side effects. She had changed to Zantac, had done well except sometimes related to a particular food. She had a flare of reflux in January, 2018, (when she had been eating an orange at bedtime for a week), so went back to nexium, which she took only for 1-2 months, then stopped. She uses Nexium just prn now, needing it much more often recently. She finds it more effective than the zantac was.  Depression and insomnia:  Sees NP Pauline Good just for med refills.  Not currently getting counseling.  She is doing well, and sleeping better since RLS medication increased to adding the 11pm dose.  Tried Belsomra last year, didn't get benefit, didn't titrate up from 15m dose.  RLS--controlled with current medication, takes 1 at 6, and 1 at 11pm, and sleeps much better since adding the second dose.   Immunization History  Administered Date(s) Administered  . Influenza Split 11/26/2012  . Influenza, High Dose Seasonal PF 08/29/2016, 08/31/2017, 08/20/2018  . Influenza,inj,Quad PF,6+ Mos 11/14/2013, 08/14/2014, 08/25/2015  . Pneumococcal Conjugate-13 08/29/2016  . Pneumococcal Polysaccharide-23 08/31/2017  . Tdap 07/04/2013  . Zoster  07/04/2013   Last Pap smear: s/p hysterectomy Last mammogram: 02/2018 Last colonoscopy: 2/2017Dr. Vira Agar in [place] adenoma and internal hemorrhoids Last DEXA: 12/2016 T-2.5 spine Dentist: twice yearly  Ophtho: yearly  Exercise: pilates 3x/week (sometimes high intensity, and resistance/weight-bearing), walking and swimming (until she broke  her elbow, then mostly walking), 2-3x/week   Other doctors caring for patient include: Dentist: Dr. Orlando Penner Ophtho: Dr. Satira Sark Cardiologist: Dr. Claiborne Billings ENT: Dr. Benjamine Mola Psych: Pauline Good, NP (for meds) GI: Dr. Gaylyn Cheers [facility]) Derm: Dr. Nehemiah Massed [facility]) Ortho: Dr. Rhona Raider (for left knee pain--got cortisone shots); Dr. Berenice Primas for L radial neck fracture 06/2018)  Depression screen: negative Fall screen: one, fractured L elbow (uneven pavement while in [place]) Functional Status Survey: notable for hearing loss, ringing in the ears, sees Dr. Benjamine Mola Mini-Cog Screen: normal (score of 5)  See full questionnaires in epic  End of Life Discussion: Patient hasa living will and medical power of attorney ,which she brought today.  Past Medical History:  Diagnosis Date  . Anxiety    GENERALIZED/WILLIAM CHEN MD  . Colon polyps   . Depression panic(dr.william chen)  . Elevated cholesterol   . GERD (gastroesophageal reflux disease) HH per pt EGD nl 2/06  . Hypothyroid   . IBS (irritable bowel syndrome) constipation(dr elliot [place])  . OSA (obstructive sleep apnea) moderate, not using CPAP, aware of risks   2007; sleep study 11/2015 NORMAL  . Osteopenia hx  . RLS (restless legs syndrome)   . Vitamin D deficiency     Past Surgical History:  Procedure Laterality Date  . ABDOMINAL HYSTERECTOMY  1999   and BSO (for benign reasons)  . APPENDECTOMY  1999  . BREAST BIOPSY Left    bx or asp not sure  . COLONOSCOPY  2011   Dr. Vira Agar [facility] [place])  . COLONOSCOPY WITH PROPOFOL N/A 01/20/2016   Procedure: COLONOSCOPY WITH PROPOFOL;  Surgeon: Manya Silvas, MD;  Location: [facility] ENDOSCOPY;  Service: Endoscopy;  Laterality: N/A;  . ESOPHAGOGASTRODUODENOSCOPY  2009  . TONSILLECTOMY      Social History   Socioeconomic History  . Marital status: Married    Spouse name: Not on file  . Number of children: 1  . Years of education: Not on file  .  Highest education level: Not on file  Occupational History  . Occupation: homemaker  Social Needs  . Financial resource strain: Not on file  . Food insecurity:    Worry: Not on file    Inability: Not on file  . Transportation needs:    Medical: Not on file    Non-medical: Not on file  Tobacco Use  . Smoking status: Never Smoker  . Smokeless tobacco: Never Used  Substance and Sexual Activity  . Alcohol use: Yes    Comment: socially, 1 glass wine bi-weekly  . Drug use: No  . Sexual activity: Yes    Partners: Male  Lifestyle  . Physical activity:    Days per week: Not on file    Minutes per session: Not on file  . Stress: Not on file  Relationships  . Social connections:    Talks on phone: Not on file    Gets together: Not on file    Attends religious service: Not on file    Active member of club or organization: Not on file    Attends meetings of clubs or organizations: Not on file    Relationship status: Not on file  . Intimate partner violence:  Fear of current or ex partner: Not on file    Emotionally abused: Not on file    Physically abused: Not on file    Forced sexual activity: Not on file  Other Topics Concern  . Not on file  Social History Narrative   Lives with husband.  Son lives in [place], married (and due to have grandson 09/2018)    Family History  Problem Relation Age of Onset  . Heart disease Mother   . Cancer Father        stomach and liver  . Sarcoidosis Sister   . Heart disease Brother 88       stents again at age 69  . Hypercholesterolemia Brother   . Depression Brother   . Breast cancer Cousin        50's  . Diabetes Other        gestational  . Heart disease Maternal Aunt   . Heart disease Paternal Aunt   . Heart disease Paternal Uncle   . Stroke Neg Hx   . Colon cancer Neg Hx     Outpatient Encounter Medications as of 09/06/2018  Medication Sig Note  . desvenlafaxine (PRISTIQ) 50 MG 24 hr tablet Take 50 mg by mouth daily.     Marland Kitchen  ezetimibe (ZETIA) 10 MG tablet TAKE 1 TABLET BY MOUTH EVERY DAY   . L-Methylfolate (DEPLIN PO) Take by mouth.   . levothyroxine (SYNTHROID, LEVOTHROID) 25 MCG tablet TAKE 1 TABLET EVERY DAY   . metoprolol succinate (TOPROL-XL) 25 MG 24 hr tablet TAKE 1 TABLET BY MOUTH EVERY DAY   . rOPINIRole (REQUIP) 0.25 MG tablet Take 0.25 mg by mouth at bedtime. Take 1 tab by mouth 1-2 hours before bed time, may increase to 5 tabs daily  09/06/2018: Take one at 6pm and 11pm  . rosuvastatin (CRESTOR) 10 MG tablet TAKE 1 TABLET BY MOUTH EVERY DAY   . [DISCONTINUED] Multiple Vitamin (MULTIVITAMIN) tablet Take 1 tablet by mouth daily. 10/24/2016: Not in the last 2 days  . cetirizine (ZYRTEC) 10 MG tablet Take 10 mg by mouth daily.   Marland Kitchen esomeprazole (NEXIUM) 40 MG capsule Take 1 capsule (40 mg total) by mouth daily. (Patient not taking: Reported on 09/06/2018) 09/06/2018: Currently needing every other day  . fluticasone (FLONASE) 50 MCG/ACT nasal spray Place 1 spray into both nostrils daily. 08/31/2017: Uses prn  . LORazepam (ATIVAN) 0.5 MG tablet Take 0.5 mg by mouth as needed.     . [DISCONTINUED] Suvorexant (BELSOMRA) 10 MG TABS Take 10 mg by mouth at bedtime. Gradually titrate up to 60m then 264mif needed (Patient not taking: Reported on 02/05/2018)   . [DISCONTINUED] trimethoprim-polymyxin b (POLYTRIM) ophthalmic solution Place 1 drop into the left eye every 4 (four) hours.    No facility-administered encounter medications on file as of 09/06/2018.     Allergies  Allergen Reactions  . Zithromax [Azithromycin] Hives     ROS: The patient denies anorexia, fever, headaches (rare migraines, occasional aura); denies vision changes, ear pain, sore throat, breast concerns, chest pain, palpitations (improved with toprol, see HPIl), dizziness, syncope, dyspnea on exertion, cough, nausea, vomiting, diarrhea, constipation, abdominal pain, melena, hematochezia, hematuria, incontinence, vaginal bleeding, discharge, odor or  itch, genital lesions, numbness, tingling, weakness, tremor, suspicious skin lesions, abnormal bleeding/bruising, or enlarged lymph nodes.  Left knee pain improved after cortisone shot, only rare discomfort. Recent R knee pain, per HPI Right shoulder and collarbone bothers her occasionally Left elbow (radial neck) fracture 06/2018 Some  hearing loss and chronic tinnitus (bilateral), unchanged. Vaginal dryness, mild Some hot flashes, improving Spot on left side of the top of the left foot that gets very itchy, comes and goes. Resolving now     PHYSICAL EXAM:  BP 132/80   Pulse 80   Ht _0  (1.676 m)   Wt 196 lb 12.8 oz (89.3 kg)   BMI 31.76 kg/m   Wt Readings from Last 3 Encounters:  09/06/18 196 lb 12.8 oz (89.3 kg)  02/05/18 195 lb 6.4 oz (88.6 kg)  12/11/17 193 lb (87.5 kg)   Weight 186 at her physical last year  General Appearance:  Alert, cooperative, no distress, appears stated age   Head:  Normocephalic, without obvious abnormality, atraumatic   Eyes:  PERRL, conjunctiva/corneas clear, EOM's intact, fundi benign   Ears:  Normal TM's and external ear canals  Nose:  Nares normal, mucosa normal, no drainage or sinus tenderness   Throat:  Lips, mucosa, and tongue normal; teeth and gums normal   Neck:  Supple, no lymphadenopathy; thyroid: no enlargement/ tenderness/nodules; no carotid bruit or JVD   Back:  Spine nontender, no curvature, ROM normal, no CVA tenderness.  Lungs:  Clear to auscultation bilaterally without wheezes, rales or ronchi; respirations unlabored   Chest Wall:  No tenderness or deformity   Heart:  Regular rate and rhythm, S1 and S2 normal, no murmur, rub or gallop   Breast Exam:  No nipple inversion, nipple discharge. No dominant masses or axillary lymphadenopathy.   Abdomen:  Soft, non-tender, nondistended, normoactive bowel sounds, no masses, no hepatosplenomegaly   Genitalia:  normal external genitalia, with mild atrophic  changes. Bimanual exam is normal--no appreciable masses. Uterus and ovaries surgically absent.   Rectal: Normal sphincter tone, no masses; heme negative stool  Extremities:  No clubbing, cyanosis or edema. Knees--no effusion, warmth, crepitus. Slightly tender laterally at R knee.  No pain with valgus/varus stress  Pulses:  2+ and symmetric all extremities   Skin:  Skin color, texture, turgor normal, no rashes. 3-48m red nodule below the left knee, unchanged (angioma). 1/2 dollar size thickened, dry area, with some healing scabs/excoriation on the lateral aspect of top of the left foot  Lymph nodes:  Cervical, supraclavicular, and axillary nodes normal   Neurologic:  CNII-XII intact, normal strength, sensation and gait; reflexes 2+ and symmetric throughout   Psych: Normal mood, affect, hygiene and grooming   Lab Results  Component Value Date   HGBA1C 5.8 (H) 08/31/2018   Fasting glucose 111    Chemistry      Component Value Date/Time   NA 144 08/31/2018 1009   K 4.7 08/31/2018 1009   CL 107 (H) 08/31/2018 1009   CO2 24 08/31/2018 1009   BUN 13 08/31/2018 1009   CREATININE 1.08 (H) 08/31/2018 1009   CREATININE 0.88 08/24/2017 1014      Component Value Date/Time   CALCIUM 9.2 08/31/2018 1009   ALKPHOS 47 08/31/2018 1009   AST 23 08/31/2018 1009   ALT 20 08/31/2018 1009   BILITOT 0.3 08/31/2018 1009     Lab Results  Component Value Date   WBC 4.6 08/31/2018   HGB 14.1 08/31/2018   HCT 42.7 08/31/2018   MCV 93 08/31/2018   PLT 255 08/31/2018   Lab Results  Component Value Date   TSH 3.390 08/31/2018   Lab Results  Component Value Date   CHOL 150 08/31/2018   HDL 63 08/31/2018   LDLCALC 69 08/31/2018   TRIG  88 08/31/2018   CHOLHDL 2.4 08/31/2018    ASSESSMENT/PLAN:  Medicare annual wellness visit, subsequent  Osteoporosis, unspecified osteoporosis type, unspecified pathological fracture presence - cont Prolia, reviewed Ca, D,  weight-bearing exercise.  DEXA due 2020 - Plan: DG Bone Density  Impaired fasting glucose - encouraged weight loss, reviewed proper diet, cont regular exercise - Plan: Hemoglobin A1c, Comprehensive metabolic panel  Pure hypercholesterolemia - will send labs to Dr. Claiborne Billings, at goal  Hypothyroidism, unspecified type - adequately replaced TSH slightly higher.  - Plan: TSH  Palpitations  RLS (restless legs syndrome) - improved; briefly discussed meds (she was asking about increasing dose, more preventatively, not rec if doing well now)  Gastroesophageal reflux disease, esophagitis presence not specified - encouraged weight loss, proper diet  Insomnia, unspecified type - improved  Eczema, unspecified type - foot--rec moisturizer and topical steroid BID for 1-2 weeks prn  Medication monitoring encounter - Plan: TSH, Comprehensive metabolic panel  BMI 11.0-31.5,XYVOP  Needs to avoid late night eating, counseled on techniques.  Discussed monthly self breast exams and yearly mammograms; at least 30 minutes of aerobic activity at least 5 days/week and weight-bearing exercise 2x/week; proper sunscreen use reviewed; healthy diet, including goals of calcium and vitamin D intake and alcohol recommendations (less than or equal to 1 drink/day) reviewed; regular seatbelt use; changing batteries in smoke detectors. Immunization recommendations discussed--continue yearly high dose flu shots. Shingrix recommended; risks/benefits/side effects reviewed, to get from pharmacy.Colonoscopy recommendations reviewed--due in 01/2021 (5 years). DEXA due 12/2018 (ok to wait and have done with mammo in 02/2019), orders entered for her to get a [facility] at [place], Full Care discussed. MOST form reviewed/updated.  F/u later this month for Prolia, and then again in 6 mos for next injection. rec 6 month med check ,with TSH, c-met and A1c prior  F/u 1 year for AWV/med check, sooner prn.    Medicare  Attestation I have personally reviewed: The patient's medical and social history Their use of alcohol, tobacco or illicit drugs Their current medications and supplements The patient's functional ability including ADLs,fall risks, home safety risks, cognitive, and hearing and visual impairment Diet and physical activities Evidence for depression or mood disorders  The patient's weight, height and BMI have been recorded in the chart.  I have made referrals, counseling, and provided education to the patient based on review of the above and I have provided the patient with a written personalized care plan for preventive services.

## 2018-09-05 NOTE — Patient Instructions (Addendum)
HEALTH MAINTENANCE RECOMMENDATIONS:  It is recommended that you get at least 30 minutes of aerobic exercise at least 5 days/week (for weight loss, you may need as much as 60-90 minutes). This can be any activity that gets your heart rate up. This can be divided in 10-15 minute intervals if needed, but try and build up your endurance at least once a week.  Weight bearing exercise is also recommended twice weekly.  Eat a healthy diet with lots of vegetables, fruits and fiber.  "Colorful" foods have a lot of vitamins (ie green vegetables, tomatoes, red peppers, etc).  Limit sweet tea, regular sodas and alcoholic beverages, all of which has a lot of calories and sugar.  Up to 1 alcoholic drink daily may be beneficial for women (unless trying to lose weight, watch sugars).  Drink a lot of water.  Calcium recommendations are 1200-1500 mg daily (1500 mg for postmenopausal women or women without ovaries), and vitamin D 1000 IU daily.  This should be obtained from diet and/or supplements (vitamins), and calcium should not be taken all at once, but in divided doses.  Monthly self breast exams and yearly mammograms for women over the age of 20 is recommended.  Sunscreen of at least SPF 30 should be used on all sun-exposed parts of the skin when outside between the hours of 10 am and 4 pm (not just when at beach or pool, but even with exercise, golf, tennis, and yard work!)  Use a sunscreen that says "broad spectrum" so it covers both UVA and UVB rays, and make sure to reapply every 1-2 hours.  Remember to change the batteries in your smoke detectors when changing your clock times in the spring and fall. Carbon monoxide detectors are recommended for your home.  Use your seat belt every time you are in a car, and please drive safely and not be distracted with cell phones and texting while driving.   Stacy Carrillo , Thank you for taking time to come for your Medicare Wellness Visit. I appreciate your ongoing  commitment to your health goals. Please review the following plan we discussed and let me know if I can assist you in the future.   These are the goals we discussed: Goals   None     This is a list of the screening recommended for you and due dates:  Health Maintenance  Topic Date Due  . Mammogram  02/16/2020  . Colon Cancer Screening  01/19/2021  . Tetanus Vaccine  07/05/2023  . Flu Shot  Completed  . DEXA scan (bone density measurement)  Completed  .  Hepatitis C: One time screening is recommended by Center for Disease Control  (CDC) for  adults born from [birth year] through 1965.   Completed  . Pneumonia vaccines  Completed   Continue yearly mammograms (02/2019 due next, not 2021 as stated above). Tdap is recommended every 10 years, the date above is 10 years from the last one given in the office.  Your next bone density test is due 12/2018 (2 year follow-up since we started the Prolia). I put the order in--you can call directly to [facility] to schedule this.  It is okay to wait until March, so that you can do the mammogram and bone density on the same day.  I recommend getting the new shingles vaccine (Shingrix). You will need to check with your insurance to see if it is covered, and if covered by Medicare Part D, you need to get  from the pharmacy rather than our office.  It is a series of 2 injections, spaced 2 months apart.  We discussed being far away from the kitchen in the evenings, to make it harder to keep snacking.  Work on good food choices, cutting back carbs, losing weight. Continue daily exercise.

## 2018-09-06 ENCOUNTER — Ambulatory Visit (INDEPENDENT_AMBULATORY_CARE_PROVIDER_SITE_OTHER): Payer: Medicare Other | Admitting: Family Medicine

## 2018-09-06 ENCOUNTER — Encounter: Payer: Self-pay | Admitting: Family Medicine

## 2018-09-06 VITALS — BP 132/80 | HR 80 | Ht 66.0 in | Wt 196.8 lb

## 2018-09-06 DIAGNOSIS — E7800 Pure hypercholesterolemia, unspecified: Secondary | ICD-10-CM

## 2018-09-06 DIAGNOSIS — Z0000 Encounter for general adult medical examination without abnormal findings: Secondary | ICD-10-CM | POA: Diagnosis not present

## 2018-09-06 DIAGNOSIS — E039 Hypothyroidism, unspecified: Secondary | ICD-10-CM

## 2018-09-06 DIAGNOSIS — M810 Age-related osteoporosis without current pathological fracture: Secondary | ICD-10-CM

## 2018-09-06 DIAGNOSIS — G2581 Restless legs syndrome: Secondary | ICD-10-CM | POA: Diagnosis not present

## 2018-09-06 DIAGNOSIS — Z5181 Encounter for therapeutic drug level monitoring: Secondary | ICD-10-CM | POA: Diagnosis not present

## 2018-09-06 DIAGNOSIS — R002 Palpitations: Secondary | ICD-10-CM

## 2018-09-06 DIAGNOSIS — Z6831 Body mass index (BMI) 31.0-31.9, adult: Secondary | ICD-10-CM

## 2018-09-06 DIAGNOSIS — K219 Gastro-esophageal reflux disease without esophagitis: Secondary | ICD-10-CM

## 2018-09-06 DIAGNOSIS — L309 Dermatitis, unspecified: Secondary | ICD-10-CM | POA: Diagnosis not present

## 2018-09-06 DIAGNOSIS — G4700 Insomnia, unspecified: Secondary | ICD-10-CM | POA: Diagnosis not present

## 2018-09-06 DIAGNOSIS — R7301 Impaired fasting glucose: Secondary | ICD-10-CM | POA: Diagnosis not present

## 2018-09-08 ENCOUNTER — Encounter: Payer: Self-pay | Admitting: Family Medicine

## 2018-09-10 ENCOUNTER — Encounter: Payer: Self-pay | Admitting: Cardiovascular Disease

## 2018-09-10 ENCOUNTER — Telehealth: Payer: Self-pay | Admitting: Family Medicine

## 2018-09-10 ENCOUNTER — Ambulatory Visit (INDEPENDENT_AMBULATORY_CARE_PROVIDER_SITE_OTHER): Payer: Medicare Other | Admitting: Cardiovascular Disease

## 2018-09-10 VITALS — BP 126/85 | HR 77 | Ht 67.0 in | Wt 198.8 lb

## 2018-09-10 DIAGNOSIS — R002 Palpitations: Secondary | ICD-10-CM

## 2018-09-10 DIAGNOSIS — E7800 Pure hypercholesterolemia, unspecified: Secondary | ICD-10-CM | POA: Diagnosis not present

## 2018-09-10 DIAGNOSIS — Z6831 Body mass index (BMI) 31.0-31.9, adult: Secondary | ICD-10-CM

## 2018-09-10 DIAGNOSIS — E6609 Other obesity due to excess calories: Secondary | ICD-10-CM

## 2018-09-10 DIAGNOSIS — G2581 Restless legs syndrome: Secondary | ICD-10-CM | POA: Diagnosis not present

## 2018-09-10 DIAGNOSIS — E039 Hypothyroidism, unspecified: Secondary | ICD-10-CM

## 2018-09-10 DIAGNOSIS — E66811 Obesity, class 1: Secondary | ICD-10-CM

## 2018-09-10 NOTE — Patient Instructions (Signed)
Medication Instructions:  Your physician recommends that you continue on your current medications as directed. Please refer to the Current Medication list given to you today.  If you need a refill on your cardiac medications before your next appointment, please call your pharmacy.    Follow-Up: At [facility], you and your health needs are our priority.  As part of our continuing mission to provide you with exceptional heart care, we have created designated Provider Care Teams.  These Care Teams include your primary Cardiologist (physician) and Advanced Practice Providers (APPs -  Physician Assistants and Nurse Practitioners) who all work together to provide you with the care you need, when you need it. You will need a follow up appointment in 12 months.  Please call our office 2 months in advance to schedule this appointment.  You may see Thomas Kelly, MD or one of the following Advanced Practice Providers on your designated Care Team: Hao Meng, PA-C . Angela Duke, PA-C  

## 2018-09-10 NOTE — Progress Notes (Signed)
Patient ID: Stacy Carrillo, female   DOB: January 02, 1951, 67 y.o.   MRN: [id number]    Primary MD: Dr. Rita Ohara  HPI: Stacy Carrillo is a 67 y.o. female who presents for an 76 month followup cardiologic evaluation.   Stacy Carrillo has a strong family history for CAD as well as hyperlipidemia. In June 2013 her total cholesterol was 273 with an LDL of 186 HDL 16 and triglycerides 94. An NMR profile showed an LDL particle #1940. Stacy Carrillo has a history of hypothyroidism on Synthroid replacement as well as migraine headaches. When I saw her in November 2013 I obtained a [facility] lab assessment with genetic profile I recommended at least a trial of Zetia in light of her marked hyperlipidemia.  Stacy Carrillo was against statin initiation and did not want to initiate therapy Zetia and sought nutritional guidance.  Stacy Carrillo significantly increased her exercise and lost approximately a 30 pound weight loss over a year.  A repeat NMR profile on 05/09/2012: total cholesterol was still elevated at 222, HDL was excellent at 71 LDL was 137. Her LDL small particles were normal at 433 but Stacy Carrillo still had increased LDL particle number of 1578 nmol per liter.  As result, I recommend initiation of Zetia 10 mg, which Stacy Carrillo conceded to initiate.  An echo Doppler study in September 2013 was essentially normal and showed an EF of greater than 55%.  There was trace tricuspid regurgitation.  Stacy Carrillo had normal right ventricular systolic pressure.  Repeat laboratory in September 2015  showed a total cholesterol 183, triglycerides 75, HDL 70, VLDL 15, and LDL cholesterol at 98.  A single blood sugar was 101.  Stacy Carrillo normal renal function.  Stacy Carrillo normal electrolytes.  Thyroid function was normal.  When I saw her last year Stacy Carrillo  had difficulty with musculoskeletal issues.  Stacy Carrillo has had hip pain and is diagnosed with an external oblique tear as well as gluteal tear.  Stacy Carrillo has not been able to exercise as regularly.    Stacy Carrillo has remained active.  Stacy Carrillo is  exercising and doing Pilates at least 3 days per week.  Stacy Carrillo does have some issues with anxiety. Stacy Carrillo has occasional chest fluttering and heart skipping. Stacy Carrillo underwent a sleep study and was  told of not having obstructive sleep apnea. I have personally reviewed this study which was done at [facility] on 11/23/2015. Her AHI was 2.4  Stacy Carrillo slept with 97.4% sleep efficiency. Her oxygen saturation nadir was 90%. Stacy Carrillo denies any chest pain.   Due to palpitations, I initiated low-dose Toprol-XL at 12.5 mg and sagittally titrated this to 12.5 mg.  Since I last saw her, Stacy Carrillo has been taking Zetia for hyperlipidemia.  Stacy Carrillo has restless legs and is on reck with.  Stacy Carrillo is on low-dose levothyroxine at 25 g for mild hypothyroidism.  Stacy Carrillo has been followed by Dr. Aris Georgia.  Stacy Carrillo has had reflux symptoms and is taking Zantac 150 mg in addition to Nexium.  In September 2017.  Stacy Carrillo had blood work by Dr. Tomi Bamberger, which showed her cholesterol had increased to 222 and LDL 140.  Her thyroid function was normal.  Stacy Carrillo normal renal function.  Hemoglobin A1c had improved to 5.4.  Stacy Carrillo denies any chest pain.  For heartburn is better since initiating Nexium.  At times Stacy Carrillo notes some tingling of her hands and feet.  Stacy Carrillo continues to do Pilates 3 days per week and uses an exercise bike at least 2-3 days per week.  I  saw her last in April 2018 Stacy Carrillo agreed to initiate statin therapy and started her on Crestor 10 mg 2 times per week for 2 weeks with very slow gradual titration as tolerated.  Stacy Carrillo was evaluated by Mammie Russian in January 2019 with palpitations.  Stacy Carrillo subsequently wore a cardiac monitor which showed sinus rhythm without episodes of atrial fibrillation.  Sinus tachycardia was present for 7% of the readable data and sinus bradycardia for 1%.  There were no pauses.  There were rare PACs and isolated unifocal PVCs.  Her palpitations have improved on current therapy.  Stacy Carrillo had follow-up laboratory on August 31, 2018 which showed marked  improvement in LDL cholesterol at 69 on her rosuvastatin milligrams in addition to Zetia 10 mg.  Continues to be on low-dose levothyroxine 25 mcg.  Stacy Carrillo has restless legs and has been taking low-dose Requip.  Stacy Carrillo is expecting her first grandchild any day.  Stacy Carrillo presents for reevaluation.  Past Medical History:  Diagnosis Date  . Anxiety    GENERALIZED/WILLIAM CHEN MD  . Colon polyps   . Depression panic(dr.[email or web address] chen)  . Elevated cholesterol   . Fracture of radial neck, closed 06/2018   fell while in [place]  . GERD (gastroesophageal reflux disease) HH per pt EGD nl 2/06  . Hypothyroid   . IBS (irritable bowel syndrome) constipation(dr elliot Eastover)  . OSA (obstructive sleep apnea) moderate, not using CPAP, aware of risks   2007; sleep study 11/2015 NORMAL  . Osteopenia hx  . RLS (restless legs syndrome)   . Vitamin D deficiency     Past Surgical History:  Procedure Laterality Date  . ABDOMINAL HYSTERECTOMY  1999   and BSO (for benign reasons)  . APPENDECTOMY  1999  . BREAST BIOPSY Left    bx or asp not sure  . COLONOSCOPY  2011   Dr. Vira Agar [facility] [place])  . COLONOSCOPY WITH PROPOFOL N/A 01/20/2016   Procedure: COLONOSCOPY WITH PROPOFOL;  Surgeon: Manya Silvas, MD;  Location: [facility] ENDOSCOPY;  Service: Endoscopy;  Laterality: N/A;  . ESOPHAGOGASTRODUODENOSCOPY  2009  . TONSILLECTOMY      Allergies  Allergen Reactions  . Zithromax [Azithromycin] Hives    Current Outpatient Medications  Medication Sig Dispense Refill  . cetirizine (ZYRTEC) 10 MG tablet Take 10 mg by mouth daily.    Marland Kitchen desvenlafaxine (PRISTIQ) 50 MG 24 hr tablet Take 50 mg by mouth daily.      Marland Kitchen esomeprazole (NEXIUM) 40 MG capsule Take 1 capsule (40 mg total) by mouth daily. 30 capsule 3  . ezetimibe (ZETIA) 10 MG tablet TAKE 1 TABLET BY MOUTH EVERY DAY 30 tablet 8  . fluticasone (FLONASE) 50 MCG/ACT nasal spray Place 1 spray into both nostrils daily.    Marland Kitchen L-Methylfolate (DEPLIN PO)  Take by mouth.    . levothyroxine (SYNTHROID, LEVOTHROID) 25 MCG tablet TAKE 1 TABLET EVERY DAY 90 tablet 0  . LORazepam (ATIVAN) 0.5 MG tablet Take 0.5 mg by mouth as needed.      . metoprolol succinate (TOPROL-XL) 25 MG 24 hr tablet TAKE 1 TABLET BY MOUTH EVERY DAY 30 tablet 8  . rOPINIRole (REQUIP) 0.25 MG tablet Take 0.25 mg by mouth at bedtime. Take 1 tab by mouth 1-2 hours before bed time, may increase to 5 tabs daily     . rosuvastatin (CRESTOR) 10 MG tablet TAKE 1 TABLET BY MOUTH EVERY DAY 90 tablet 1   No current facility-administered medications for this visit.     Socially  Stacy Carrillo is married to [employer].  There is no tobacco use.  Stacy Carrillo does exercise and performs Pilates 3 days per week.  ROS General: Negative; No fevers, chills, or night sweats; positive for weight gain HEENT: Negative; No changes in vision or hearing, sinus congestion, difficulty swallowing Pulmonary: Negative; No cough, wheezing, shortness of breath, hemoptysis Cardiovascular: See history of present illness GI: Positive for GERD GU: Negative; No dysuria, hematuria, or difficulty voiding Musculoskeletal: Positive for recent external oblique tear as well as gluteus tear Hematologic/Oncology: Negative; no easy bruising, bleeding Endocrine: Stacy Carrillo had been started on low-dose levothyroxine 25 g.  No diabetes Neuro: Negative; no changes in balance, headaches Skin: Negative; No rashes or skin lesions Psychiatric: Positive for mild anxiety Sleep:  Positive for restless legs; No snoring, daytime sleepiness, hypersomnolence, bruxism, hypnogognic hallucinations, no cataplexy Other comprehensive 14 point system review is negative.   PE BP 126/85   Pulse 77   Ht _0  (1.702 m)   Wt 198 lb 12.8 oz (90.2 kg)   BMI 31.14 kg/m    Blood pressure by me 116/80  Wt Readings from Last 3 Encounters:  09/10/18 198 lb 12.8 oz (90.2 kg)  09/06/18 196 lb 12.8 oz (89.3 kg)  02/05/18 195 lb 6.4 oz (88.6 kg)   General:  Alert, oriented, no distress.  Skin: normal turgor, no rashes, warm and dry HEENT: Normocephalic, atraumatic. Pupils equal round and reactive to light; sclera anicteric; extraocular muscles intact;  Nose without nasal septal hypertrophy Mouth/Parynx benign; Mallinpatti scale 2 Neck: No JVD, no carotid bruits; normal carotid upstroke Lungs: clear to ausculatation and percussion; no wheezing or rales Chest wall: without tenderness to palpitation Heart: PMI not displaced, RRR, s1 s2 normal, 1/6 systolic murmur, no diastolic murmur, no rubs, gallops, thrills, or heaves Abdomen: soft, nontender; no hepatosplenomehaly, BS+; abdominal aorta nontender and not dilated by palpation. Back: no CVA tenderness Pulses 2+ Musculoskeletal: full range of motion, normal strength, no joint deformities Extremities: no clubbing cyanosis or edema, Homan's sign negative  Neurologic: grossly nonfocal; Cranial nerves grossly wnl Psychologic: Normal mood and affect   ECG (independently read by me): NSR aT 77; NORMALINTERVALS; NO ECTOPY  April 2018 ECG (independently read by me): Normal sinus rhythm at 78 bpm.  Normal intervals.  No ST segment changes.  April 2017 ECG (independently read by me): Normal sinus rhythm at 74 bpm.  Normal intervals.  No ectopy.  January 2017 ECG (independently read by me): normal sinus rhythm at 74 bpm.  Normal intervals.  December 2015 ECG (and apparently read by me):  Normal sinus rhythm at 75 bpm.  No ectopy.  Normal intervals.  Prior June 2014 ECG: Normal sinus rhythm at 65 beats per minute   LABS: BMP Latest Ref Rng & Units 08/31/2018 08/24/2017 08/25/2016  Glucose 65 - 99 mg/dL 111(H) 115(H) 103(H)  BUN 8 - 27 mg/dL _1 Creatinine 0.57 - 1.00 mg/dL 1.08(H) 0.88 0.99  BUN/Creat Ratio 12 - 28 12 NOT APPLICABLE -  Sodium 941 - 144 mmol/L 144 139 140  Potassium 3.5 - 5.2 mmol/L 4.7 4.1 4.6  Chloride 96 - 106 mmol/L 107(H) 109 106  CO2 20 - 29 mmol/L _2 Calcium  8.7 - 10.3 mg/dL 9.2 9.2 9.1   Hepatic Function Latest Ref Rng & Units 08/31/2018 08/24/2017 08/25/2016  Total Protein 6.0 - 8.5 g/dL 6.9 6.7 6.7  Albumin 3.6 - 4.8 g/dL 4.3 - 4.0  AST 0 - 40 IU/L 23 24  23  ALT 0 - 32 IU/L _0 Alk Phosphatase 39 - 117 IU/L 47 - 47  Total Bilirubin 0.0 - 1.2 mg/dL 0.3 0.6 0.6   CBC Latest Ref Rng & Units 08/31/2018 08/24/2017 08/25/2016  WBC 3.4 - 10.8 x10E3/uL 4.6 4.7 4.6  Hemoglobin 11.1 - 15.9 g/dL 14.1 14.8 14.0  Hematocrit 34.0 - 46.6 % 42.7 43.1 42.1  Platelets 150 - 450 x10E3/uL 255 163 258   Lab Results  Component Value Date   MCV 93 08/31/2018   MCV 90.5 08/24/2017   MCV 92.1 08/25/2016   Lab Results  Component Value Date   TSH 3.390 08/31/2018   Lab Results  Component Value Date   HGBA1C 5.8 (H) 08/31/2018   Lipid Panel     Component Value Date/Time   CHOL 150 08/31/2018 1009   CHOL 222 (H) 05/09/2013 1117   TRIG 88 08/31/2018 1009   TRIG 70 05/09/2013 1117   HDL 63 08/31/2018 1009   HDL 71 05/09/2013 1117   CHOLHDL 2.4 08/31/2018 1009   CHOLHDL 2.3 08/24/2017 1014   VLDL 20 08/25/2016 1037   LDLCALC 69 08/31/2018 1009   LDLCALC 67 08/24/2017 1014   LDLCALC 137 (H) 05/09/2013 1117     RADIOLOGY: No results found.  IMPRESSION: 1. Palpitation   2. Pure hypercholesterolemia   3. Restless legs syndrome   4. Hypothyroidism, unspecified type   5. Class 1 obesity due to excess calories without serious comorbidity with body mass index (BMI) of 31.0 to 31.9 in adult     ASSESSMENT AND PLAN: Stacy Carrillo is a 67 year old WF who has a history of hyperlipidemia and past refused consideration to take statins but had been tolerating Zetia 10 mg.  When I last saw her in April 2018 due to persistent LDL elevation Stacy Carrillo ultimately agreed to initiate rosuvastatin.  I started her at very low dose and Stacy Carrillo now has been able to titrate to 10 mg daily and continues to take Zetia.  Her most recent LDL cholesterol is significantly improved  at 69.  LFTs are normal.  He has experienced palpitations in the past.  Prolonged cardiac monitoring did not reveal any high-grade ectopy and only showed isolated PVCs and PACs.  Stacy Carrillo is now on Toprol-XL 25 mg daily.  Stacy Carrillo continues to be on low-dose levothyroxine.  Most recent TSH on August 31, 2018 was normal at 3.39.  Stacy Carrillo has issues with restless legs.  I discussed with her that Stacy Carrillo may require a slight dose adjustment and Stacy Carrillo will discuss this with her primary MD.  Stacy Carrillo is not having any chest pain or anginal type symptoms.  Stacy Carrillo has GERD which is controlled with Nexium.  Stacy Carrillo sustained a left elbow fracture this past summer while walking in [place].  This ultimately has healed.  Stacy Carrillo wishes to proceed pursue swimming again now that her elbow is improved.  Gust the importance of continued exercise.  BMI is consistent with mild obesity at 31.14 and weight reduction was recommended.  I will see her in 1 year for reevaluation.  Time spent: 25 minutes  Troy Sine, MD, [facility]  09/11/2018 2:41 PM

## 2018-09-10 NOTE — Telephone Encounter (Signed)
Left message for pt to call concerning next prolia injection. Pt needs an appt after 09/28/2018. Estimated cost will be $0. Please advise Juliann Pulse if pt calls to schedule.

## 2018-09-11 ENCOUNTER — Encounter: Payer: Self-pay | Admitting: Cardiovascular Disease

## 2018-10-02 ENCOUNTER — Other Ambulatory Visit (INDEPENDENT_AMBULATORY_CARE_PROVIDER_SITE_OTHER): Payer: Medicare Other

## 2018-10-02 DIAGNOSIS — M810 Age-related osteoporosis without current pathological fracture: Secondary | ICD-10-CM | POA: Diagnosis not present

## 2018-10-02 MED ORDER — DENOSUMAB 60 MG/ML ~~LOC~~ SOSY
60.0000 mg | PREFILLED_SYRINGE | Freq: Once | SUBCUTANEOUS | Status: AC
Start: 2018-10-02 — End: 2018-10-02
  Administered 2018-10-02: 60 mg via SUBCUTANEOUS

## 2018-11-12 ENCOUNTER — Other Ambulatory Visit: Payer: Self-pay | Admitting: Family Medicine

## 2018-12-13 ENCOUNTER — Other Ambulatory Visit: Payer: Self-pay | Admitting: Cardiovascular Disease

## 2018-12-13 NOTE — Telephone Encounter (Signed)
Rx has been sent to the pharmacy electronically. ° °

## 2018-12-31 ENCOUNTER — Encounter: Payer: Self-pay | Admitting: Family Medicine

## 2018-12-31 ENCOUNTER — Ambulatory Visit (INDEPENDENT_AMBULATORY_CARE_PROVIDER_SITE_OTHER): Payer: Medicare Other | Admitting: Family Medicine

## 2018-12-31 VITALS — BP 116/72 | HR 84 | Temp 97.9°F | Ht 67.0 in | Wt 204.0 lb

## 2018-12-31 DIAGNOSIS — R52 Pain, unspecified: Secondary | ICD-10-CM | POA: Diagnosis not present

## 2018-12-31 DIAGNOSIS — J3489 Other specified disorders of nose and nasal sinuses: Secondary | ICD-10-CM | POA: Diagnosis not present

## 2018-12-31 DIAGNOSIS — J069 Acute upper respiratory infection, unspecified: Secondary | ICD-10-CM

## 2018-12-31 LAB — POCT INFLUENZA A/B
Influenza A, POC: NEGATIVE
Influenza B, POC: NEGATIVE

## 2018-12-31 NOTE — Progress Notes (Signed)
Chief Complaint  Patient presents with  . Facial Pain    started last Wed and she has just been feeling really bad since. Face and teeth hurt. She would like a flu test to make sure she does not have it as she is supposed to watch 51 month old grandson and her daughter in law is a pediatrician she would like to make sure.     5 days ago she started with sneezing, headache, runny nose.  Denies sore throat, ear pain.  +postnasal drainage.  Facial pain started the next day--above the left eye, but also into both cheeks and teeth.  Mucus from nose is clear.  Phlegm is also clear.  +fatigue, stayed in bed a couple of days, no significant myalgias.  Using Flonase (just started with this illness, recently), saline spray and tylenol (not today).  Did not use neti-pot (has one, doesn't really like), doesn't like decongestants (make her hyper).  Has Mucinex, but hasn't used it.  +sick contact--63 month old grandson has been sick, and she watches him 2 days/week.  She is supposed to watch him, and daughter-in-law (pediatrician) prefers her to be checked for influenza (since baby is <6 mos).  PMH, PSH, SH reviewed  Outpatient Encounter Medications as of 12/31/2018  Medication Sig Note  . desvenlafaxine (PRISTIQ) 50 MG 24 hr tablet Take 50 mg by mouth daily.     Marland Kitchen ezetimibe (ZETIA) 10 MG tablet TAKE 1 TABLET BY MOUTH EVERY DAY   . fluticasone (FLONASE) 50 MCG/ACT nasal spray Place 1 spray into both nostrils daily. 08/31/2017: Uses prn  . L-Methylfolate (DEPLIN PO) Take by mouth.   . levothyroxine (SYNTHROID, LEVOTHROID) 25 MCG tablet TAKE 1 TABLET BY MOUTH EVERY DAY   . metoprolol succinate (TOPROL-XL) 25 MG 24 hr tablet TAKE 1 TABLET BY MOUTH EVERY DAY   . rOPINIRole (REQUIP) 0.25 MG tablet Take 0.25 mg by mouth at bedtime. Take 1 tab by mouth 1-2 hours before bed time, may increase to 5 tabs daily  09/06/2018: Take one at 6pm and 11pm  . rosuvastatin (CRESTOR) 10 MG tablet TAKE 1 TABLET BY MOUTH EVERY DAY    . cetirizine (ZYRTEC) 10 MG tablet Take 10 mg by mouth daily.   Marland Kitchen esomeprazole (NEXIUM) 40 MG capsule Take 1 capsule (40 mg total) by mouth daily. (Patient not taking: Reported on 12/31/2018) 09/06/2018: Currently needing every other day  . LORazepam (ATIVAN) 0.5 MG tablet Take 0.5 mg by mouth as needed.      No facility-administered encounter medications on file as of 12/31/2018.    Just started flonase back, hasn't been using zyrtec recently.  Allergies  Allergen Reactions  . Zithromax [Azithromycin] Hives   ROS:  Some chills, no known high fever, some possible low grade (thermometer was broken, cheeks were red).  No nausea, vomiting, diarrhea, rash, bleeding, bruising or other concerns   PHYSICAL EXAM:  BP 116/72   Pulse 84   Temp 97.9 F (36.6 C) (Tympanic)   Ht 5\' 7"  (1.702 m)   Wt 204 lb (92.5 kg)   BMI 31.95 kg/m    Pleasant, well-appearing female in no distress HEENT: conjunctiva and sclera are clear, EOMI. TM's and EAC's normal.  Nasal mucosa is mild-mod edematous, no erythema or purulence.  Sinuses--tender at left frontal and bilateral maxillary sinuses. OP is clear Neck: no lymphadenopathy or mass Heart: regular rate and rhythm Lungs: clear bilaterally Skin: normal turgor, no rash Neuro: alert and oriented, cranial nerves intact, normal gait  Influenza tests negative   ASSESSMENT/PLAN:  Viral upper respiratory infection - supportive measures reviewed (mucinex, sinus rinses); reviewed s/sx bacterial infection, to contact us if develops  Generalized body aches - Plan: Influenza A/B  Sinus pain - due to being full, not infected. Encouraged sinus rinses, mucinex (doesn't tolerate decongestants) - Plan: Influenza A/B      Drink plenty of water. Continue the Flonase. Use Zyrtec along with the flonase. Start mucinex and use as directed, to help thin out the secretions which may help with the sinus pressure. Consider doing sinus rinses once or twice daily. If  it changes to a discolored drainage, please let us know ,and we will send in an antibiotic to cover a sinus infection.

## 2018-12-31 NOTE — Patient Instructions (Signed)
Drink plenty of water. Continue the Flonase. Use Zyrtec along with the flonase. Start mucinex and use as directed, to help thin out the secretions which may help with the sinus pressure. Consider doing sinus rinses once or twice daily. If it changes to a discolored drainage, please let us know ,and we will send in an antibiotic to cover a sinus infection.

## 2019-01-04 DIAGNOSIS — H838X3 Other specified diseases of inner ear, bilateral: Secondary | ICD-10-CM | POA: Diagnosis not present

## 2019-01-04 DIAGNOSIS — J310 Chronic rhinitis: Secondary | ICD-10-CM | POA: Diagnosis not present

## 2019-01-04 DIAGNOSIS — H9313 Tinnitus, bilateral: Secondary | ICD-10-CM | POA: Diagnosis not present

## 2019-01-04 DIAGNOSIS — H903 Sensorineural hearing loss, bilateral: Secondary | ICD-10-CM | POA: Diagnosis not present

## 2019-01-09 ENCOUNTER — Other Ambulatory Visit: Payer: Self-pay | Admitting: Cardiovascular Disease

## 2019-01-10 ENCOUNTER — Other Ambulatory Visit: Payer: Self-pay | Admitting: Cardiovascular Disease

## 2019-01-24 DIAGNOSIS — F331 Major depressive disorder, recurrent, moderate: Secondary | ICD-10-CM | POA: Diagnosis not present

## 2019-02-08 ENCOUNTER — Other Ambulatory Visit: Payer: Self-pay | Admitting: Family Medicine

## 2019-02-28 ENCOUNTER — Other Ambulatory Visit: Payer: Medicare Other

## 2019-03-02 ENCOUNTER — Other Ambulatory Visit: Payer: Self-pay | Admitting: Family Medicine

## 2019-03-07 ENCOUNTER — Encounter: Payer: Medicare Other | Admitting: Family Medicine

## 2019-04-01 ENCOUNTER — Other Ambulatory Visit (INDEPENDENT_AMBULATORY_CARE_PROVIDER_SITE_OTHER): Payer: Medicare Other

## 2019-04-01 ENCOUNTER — Other Ambulatory Visit: Payer: Self-pay

## 2019-04-01 DIAGNOSIS — M810 Age-related osteoporosis without current pathological fracture: Secondary | ICD-10-CM | POA: Diagnosis not present

## 2019-04-01 MED ORDER — DENOSUMAB 60 MG/ML ~~LOC~~ SOSY
60.0000 mg | PREFILLED_SYRINGE | Freq: Once | SUBCUTANEOUS | Status: AC
  Administered 2019-04-01: 12:00:00 60 mg via SUBCUTANEOUS

## 2019-04-03 ENCOUNTER — Other Ambulatory Visit: Payer: Self-pay | Admitting: Family Medicine

## 2019-04-15 DIAGNOSIS — Z1159 Encounter for screening for other viral diseases: Secondary | ICD-10-CM | POA: Diagnosis not present

## 2019-04-19 ENCOUNTER — Other Ambulatory Visit: Payer: Self-pay

## 2019-04-22 ENCOUNTER — Encounter: Payer: Self-pay | Admitting: Family Medicine

## 2019-04-22 ENCOUNTER — Other Ambulatory Visit: Payer: Medicare Other

## 2019-04-22 ENCOUNTER — Other Ambulatory Visit: Payer: Self-pay

## 2019-04-22 DIAGNOSIS — Z5181 Encounter for therapeutic drug level monitoring: Secondary | ICD-10-CM

## 2019-04-22 DIAGNOSIS — R7301 Impaired fasting glucose: Secondary | ICD-10-CM | POA: Diagnosis not present

## 2019-04-22 DIAGNOSIS — E039 Hypothyroidism, unspecified: Secondary | ICD-10-CM | POA: Diagnosis not present

## 2019-04-23 LAB — HEMOGLOBIN A1C
Est. average glucose Bld gHb Est-mCnc: 126 mg/dL
Hgb A1c MFr Bld: 6 % — ABNORMAL HIGH (ref 4.8–5.6)

## 2019-04-23 LAB — COMPREHENSIVE METABOLIC PANEL
ALT: 20 IU/L (ref 0–32)
AST: 24 IU/L (ref 0–40)
Albumin/Globulin Ratio: 1.8 (ref 1.2–2.2)
Albumin: 4.1 g/dL (ref 3.8–4.8)
Alkaline Phosphatase: 47 IU/L (ref 39–117)
BUN/Creatinine Ratio: 16 (ref 12–28)
BUN: 16 mg/dL (ref 8–27)
Bilirubin Total: 0.4 mg/dL (ref 0.0–1.2)
CO2: 20 mmol/L (ref 20–29)
Calcium: 8.6 mg/dL — ABNORMAL LOW (ref 8.7–10.3)
Chloride: 104 mmol/L (ref 96–106)
Creatinine, Ser: 1.03 mg/dL — ABNORMAL HIGH (ref 0.57–1.00)
GFR calc Af Amer: 65 mL/min/{1.73_m2} (ref >59)
GFR calc non Af Amer: 56 mL/min/{1.73_m2} — ABNORMAL LOW (ref >59)
Globulin, Total: 2.3 g/dL (ref 1.5–4.5)
Glucose: 119 mg/dL — ABNORMAL HIGH (ref 65–99)
Potassium: 4.5 mmol/L (ref 3.5–5.2)
Sodium: 138 mmol/L (ref 134–144)
Total Protein: 6.4 g/dL (ref 6.0–8.5)

## 2019-04-23 LAB — TSH: TSH: 3.95 u[IU]/mL (ref 0.450–4.500)

## 2019-04-23 NOTE — Progress Notes (Signed)
Start time: 11:03 End time: 11:51  Virtual Visit via Video Note  I connected with Stacy Carrillo on 04/24/2019 by a video enabled telemedicine application and verified that I am speaking with the correct person using two identifiers.  Location: Patient: at home, with her dog Provider: home office   I discussed the limitations of evaluation and management by telemedicine and the availability of in person appointments. The patient expressed understanding and agreed to proceed. Also understands and consents to [place] filing her insurance for this visit.  History of Present Illness:  Chief Complaint  Patient presents with  . Hypothyroidism    VIRTUAL med check. Has been having trouble sleeping lately. No vitals and did not come in building for labs the other dyay (drawn in her car).   She had negative Coronavirus test 5/11 (through [facility]). Her husband had been sick with URI symptoms, she was asymptomatic.  They were both tested and both negative.  Osteoporosis: She has been getting Prolia injections since March, 2018, tolerating without side effects. Last injection was 04/01/2019.  Last DEXA in 12/2016 showed: ASSESSMENT: The BMD measured at AP Spine L1-L4 is 0.888 g/cm2 with a T-score of -2.5. This patient is considered osteoporotic according to [facility]) criteria. Site Region Measured Measured WHO Young Adult BMD Date Age Classification T-score AP Spine L1-L4 12/15/2016 65.3 Osteoporosis -2.5 0.888 g/cm2 DualFemur Neck Right 12/15/2016 65.3 Osteopenia -1.6 0.821 g/cm2  Follow-up DEXA was ordered 09/2018 (to be done 12/2018).  Not yet scheduled, had been waiting to schedule with her mammogram, not done due to COVID-19.  Hyperlipidemia: She is under the care of Dr. Claiborne Billings. She had been on Zetia, had refusedstatins but finally put on Crestor in April, 2018. LDL went from 140 on zetia alone, to 67 on Crestor. She denies side effects from the  statin. Last lipids were at goal. 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 Every once in a while she feels like something jumps/skips in her throat/upper chest, relieved by a cough in a few seconds. This remains very infrequent, and she typically "ignores it" since monitors haven't shown anything.  Hypothyroidism: On generic thyroid medication. Taking it on an empty stomach, separate from other medications and vitamins. Denies any missed doses. No changes in bowels, hair/skin/nails, energy, moods. Maybe hair is falling out a little more, no thinning noted.  She has h/o GERD and HH. She had taken Nexium for a long time (approx 10 years), had been off for over 5 years. She stopped due to potential concerns with long-term use, not any side effects. Shehadchanged to Zantac for a while, but has been using Nexium just prn. She finds it more effective than the zantac was. She uses this just prn, a couple of times/week.  Depression and insomnia: Sees NP Pauline Good just for med refills. Not currently getting counseling. She is doing well, and sleeping better since RLS medication increased to adding the 11pm dose. She had tried Belsomra in the past (but didn't titrate above 10mg  dose, wasn't effective).  RLS--controlled with current medication, takes at 6 and 11pm. Initially reported sleeping much better since the second dose was added.  More recently she has been having some trouble sleeping 1-2x/week.  Sometimes  she has trouble falling asleep, other times she will wake up and have trouble getting back to sleep.  When she can't sleep, she tosses, turns, can't be still, sometimes has a hot flash.  Occurs 1-2x/week.  Can't fall asleep. Sometimes gets up to  read, glass of milk. Sometimes there may be a component of RLS, but not always.  Can't find a pattern (not related to exercise, meds,diet)  PMH, PSH, SH reviewed  Outpatient Encounter Medications as of 04/24/2019  Medication Sig Note  . desvenlafaxine (PRISTIQ) 50 MG 24 hr tablet Take 50 mg by mouth daily.     Marland Kitchen esomeprazole (NEXIUM) 40 MG capsule Take 1 capsule (40 mg total) by mouth daily. 04/24/2019: Using OTC Nexium (20mg ) just a couple of times/week  . ezetimibe (ZETIA) 10 MG tablet TAKE 1 TABLET BY MOUTH EVERY DAY   . fluticasone (FLONASE) 50 MCG/ACT nasal spray Place 1 spray into both nostrils daily. 08/31/2017: Uses prn  . L-Methylfolate (DEPLIN PO) Take by mouth.   . levothyroxine (SYNTHROID) 25 MCG tablet TAKE 1 TABLET BY MOUTH EVERY DAY   . metoprolol succinate (TOPROL-XL) 25 MG 24 hr tablet TAKE 1 TABLET BY MOUTH EVERY DAY 04/24/2019: Takes 1/2 tablet in the morning  . rOPINIRole (REQUIP) 0.25 MG tablet Take 0.25 mg by mouth at bedtime. Take 1 tab by mouth 1-2 hours before bed time, may increase to 5 tabs daily  09/06/2018: Take one at 6pm and 11pm  . rosuvastatin (CRESTOR) 10 MG tablet TAKE 1 TABLET BY MOUTH EVERY DAY   . cetirizine (ZYRTEC) 10 MG tablet Take 10 mg by mouth daily.   Marland Kitchen LORazepam (ATIVAN) 0.5 MG tablet Take 0.5 mg by mouth as needed.      No facility-administered encounter medications on file as of 04/24/2019.    Allergies  Allergen Reactions  . Zithromax [Azithromycin] Hives   ROS: no fever, chills, URI symptoms, chest pain, shortness of breath. Intermittent insomnia per HPI.   Observations/Objective:  Ht 5\' 7"  (1.702 m)   BMI 31.95 kg/m   Hasn't weighed self.  Doesn't believe she has gained weight. Using Peloton 2x/week  Wt Readings from Last 3 Encounters:  12/31/18 204 lb (92.5 kg)  09/10/18 198 lb 12.8 oz (90.2 kg)  09/06/18 196 lb 12.8 oz (89.3 kg)   Exam is limited due to virtual nature of the visit. She is alert, oriented, in good spirits with  full range of affect. She has normal cranial nerves, memory.    Chemistry      Component Value Date/Time   NA 138 04/22/2019 1015   K 4.5 04/22/2019 1015   CL 104 04/22/2019 1015   CO2 20 04/22/2019 1015   BUN 16 04/22/2019 1015   CREATININE 1.03 (H) 04/22/2019 1015   CREATININE 0.88 08/24/2017 1014      Component Value Date/Time   CALCIUM 8.6 (L) 04/22/2019 1015   ALKPHOS 47 04/22/2019 1015   AST 24 04/22/2019 1015   ALT 20 04/22/2019 1015   BILITOT 0.4 04/22/2019 1015     Fasting glucose 119  Lab Results  Component Value Date   HGBA1C 6.0 (H) 04/22/2019   Lab Results  Component Value Date   TSH 3.950 04/22/2019    Assessment and Plan:  Osteoporosis, unspecified osteoporosis type, unspecified pathological fracture presence - discussed Ca recommendations; cont Vit D, weight-bearing exercise.  cont Prolia (will need to recheck Ca prior to her next injection)  Impaired fasting glucose - Encouraged daily exercise, proper diet  Hypothyroidism, unspecified type - Continue current medication  Pure hypercholesterolemia - at goal per last check.  Can discuss with Dr. Claiborne Billings whether or not she needs to stay on Zetia, since now on Crestor also  RLS (restless legs syndrome) - May take an add'l requip prn on those nights where RLS is keeping her up  Insomnia, unspecified type - reviewed sleep hygiene in detail, including visualization techniques, OTC meds. She asked about restarting Belsomra, can start 15, up to 20mg  max   Hypocalcemia--noted 3 weeks after Prolia injection, all prior checks were normal. Recheck PRIOR to next Prolia injection. Her CPE will be prior to next dose, so can draw at visit.  Extra requip if needed, if seems like RLS is contributing to the poor sleep. Counseled re: sleep hygiene in detail, and other measures for sleep. She asked about retrying Belsomra, still had some at home.  Reviewed how it works, takes longer, and can start at 15mg  (since 10mg   ineffective), titrate to max of 20mg  if needed.   F/u as scheduled in October for CPE (fasting)   Follow Up Instructions:    I discussed the assessment and treatment plan with the patient. The patient was provided an opportunity to ask questions and all were answered. The patient agreed with the plan and demonstrated an understanding of the instructions.   The patient was advised to call back or seek an in-person evaluation if the symptoms worsen or if the condition fails to improve as anticipated.  I provided 48 minutes of non-face-to-face time during this encounter.   Vikki Ports, MD

## 2019-04-24 ENCOUNTER — Other Ambulatory Visit: Payer: Self-pay

## 2019-04-24 ENCOUNTER — Encounter: Payer: Self-pay | Admitting: Family Medicine

## 2019-04-24 ENCOUNTER — Ambulatory Visit (INDEPENDENT_AMBULATORY_CARE_PROVIDER_SITE_OTHER): Payer: Medicare Other | Admitting: Family Medicine

## 2019-04-24 VITALS — Ht 67.0 in

## 2019-04-24 DIAGNOSIS — E7800 Pure hypercholesterolemia, unspecified: Secondary | ICD-10-CM

## 2019-04-24 DIAGNOSIS — M810 Age-related osteoporosis without current pathological fracture: Secondary | ICD-10-CM | POA: Diagnosis not present

## 2019-04-24 DIAGNOSIS — G2581 Restless legs syndrome: Secondary | ICD-10-CM

## 2019-04-24 DIAGNOSIS — R7301 Impaired fasting glucose: Secondary | ICD-10-CM

## 2019-04-24 DIAGNOSIS — G4700 Insomnia, unspecified: Secondary | ICD-10-CM | POA: Diagnosis not present

## 2019-04-24 DIAGNOSIS — E039 Hypothyroidism, unspecified: Secondary | ICD-10-CM

## 2019-04-24 NOTE — Patient Instructions (Addendum)
See information below for advice on sleep hygiene. If it seems like the restless legs is keeping you from sleeping, you may take an extra dose when needed (on those 1-2 nights/week that you're struggling).  You had asked about restarting Belsomra.  Since the 10mg  dose didn't help, you can start by taking 1.5mg  tablets, maximum dose is 20mg  (2 tablets).  It may take a few days to see the full effect.  We briefly discussed that you may want to ask Dr. Claiborne Billings at your next visit whether or not you truly need to stay on the Zetia (since you are now on a statin that you're tolerating and works better).  Try and get at least 150 minutes of cardio each week, weight-bearing exercise at least 2x/week.  We discussed trying to keep a routine, including resuming your pilates. Your sugars were up a little.  Watching your carbs, sweets, getting daily exercise can help keep the sugars down.  See you in October!

## 2019-05-05 ENCOUNTER — Other Ambulatory Visit: Payer: Self-pay | Admitting: Family Medicine

## 2019-07-25 ENCOUNTER — Ambulatory Visit (INDEPENDENT_AMBULATORY_CARE_PROVIDER_SITE_OTHER): Payer: Medicare Other | Admitting: Family Medicine

## 2019-07-25 ENCOUNTER — Encounter: Payer: Self-pay | Admitting: Family Medicine

## 2019-07-25 ENCOUNTER — Other Ambulatory Visit: Payer: Self-pay

## 2019-07-25 VITALS — BP 130/72 | HR 72 | Temp 97.1°F | Ht 67.0 in | Wt 204.6 lb

## 2019-07-25 DIAGNOSIS — Z23 Encounter for immunization: Secondary | ICD-10-CM | POA: Diagnosis not present

## 2019-07-25 DIAGNOSIS — R350 Frequency of micturition: Secondary | ICD-10-CM

## 2019-07-25 DIAGNOSIS — M545 Low back pain, unspecified: Secondary | ICD-10-CM

## 2019-07-25 LAB — POCT URINALYSIS DIP (PROADVANTAGE DEVICE)
Bilirubin, UA: NEGATIVE
Blood, UA: NEGATIVE
Glucose, UA: NEGATIVE mg/dL
Ketones, POC UA: NEGATIVE mg/dL
Leukocytes, UA: NEGATIVE
Nitrite, UA: NEGATIVE
Protein Ur, POC: NEGATIVE mg/dL
Specific Gravity, Urine: 1.02
Urobilinogen, Ur: NEGATIVE
pH, UA: 6 (ref 5.0–8.0)

## 2019-07-25 NOTE — Progress Notes (Signed)
Chief Complaint  Patient presents with  . Urinary Frequency    slight frequency, but more pressure in lower abdomen and burning/tingling when she is not urinating. Right sided lbp x 1 week.   She is having about a week of lower abdominal pressure.  Has some burning discomfort in the lower abdomen, not burning while voiding. Denies any vaginal discharge, no constipation or diarrhea. This doesn't feel similar to prior UTI's. Denies urinary incontinence. Thinks bladder doesn't empty as well, sometimes has a little more to empty at the end. Denies any known prolapse.  She is s/p TAH/BSO for benign reasons.     She also has some soreness on the right lower back--thought maybe she strained it, but wonders if there is a connection. No radiation of pain, numbness, tingling, weakness.   PMH, PSH, SH reviewed  Outpatient Encounter Medications as of 07/25/2019  Medication Sig Note  . desvenlafaxine (PRISTIQ) 50 MG 24 hr tablet Take 50 mg by mouth daily.     Marland Kitchen esomeprazole (NEXIUM) 40 MG capsule Take 1 capsule (40 mg total) by mouth daily. 04/24/2019: Using OTC Nexium (20mg ) just a couple of times/week  . ezetimibe (ZETIA) 10 MG tablet TAKE 1 TABLET BY MOUTH EVERY DAY   . L-Methylfolate (DEPLIN PO) Take by mouth.   . levothyroxine (SYNTHROID) 25 MCG tablet TAKE 1 TABLET BY MOUTH EVERY DAY   . metoprolol succinate (TOPROL-XL) 25 MG 24 hr tablet TAKE 1 TABLET BY MOUTH EVERY DAY 04/24/2019: Takes 1/2 tablet in the morning  . rOPINIRole (REQUIP) 0.25 MG tablet Take 0.25 mg by mouth at bedtime. Take 1 tab by mouth 1-2 hours before bed time, may increase to 5 tabs daily  09/06/2018: Take one at 6pm and 11pm  . rosuvastatin (CRESTOR) 10 MG tablet TAKE 1 TABLET BY MOUTH EVERY DAY   . cetirizine (ZYRTEC) 10 MG tablet Take 10 mg by mouth daily.   . fluticasone (FLONASE) 50 MCG/ACT nasal spray Place 1 spray into both nostrils daily. 08/31/2017: Uses prn  . LORazepam (ATIVAN) 0.5 MG tablet Take 0.5 mg by mouth as  needed.      No facility-administered encounter medications on file as of 07/25/2019.    Allergies  Allergen Reactions  . Zithromax [Azithromycin] Hives    ROS: no fever, chills, headaches, dizziness, URI symptoms, cough, shortness of breath, chest pain.  No nausea, vomiting, diarrhea. Urinary complaints as per HPI   PHYSICAL EXAM:  BP 130/72   Pulse 72   Temp (!) 97.1 F (36.2 C) (Other (Comment))   Ht 5\' 7"  (1.702 m)   Wt 204 lb 9.6 oz (92.8 kg)   BMI 32.04 kg/m   Pleasant, well-appearing female, in good spirits, in no distress HEENT: conjunctiva and sclera are clear, EOMI. Wearing mask Neck: no lymphadenopathy or mass Back: no spinal or CVA tenderness.  Area of discomfort is paraspinous muscles in right lower lumbar area.  No significant spasm, but tender over the muscle. Pelvic--External genitalia normal, mild atrophy, no lesions or rashes.  Bimanual exam--uterus is absent, bladder is nontender; mild tenderness at R adnexa, no mass, nontender on the left. No prolapse. Rectal: soft, heme negative stool, no mass Skin: normal turgor, no rash   Urine dip: SG 1.020, otherwise negative   ASSESSMENT/PLAN:  Urinary frequency - Plan: POCT Urinalysis DIP (Proadvantage Device)  Need for influenza vaccination - Plan: Flu Vaccine QUAD High Dose(Fluad)  Right low back pain, unspecified chronicity, unspecified whether sciatica present - Plan: POCT Urinalysis DIP (Proadvantage  Device)  Reassured no e/o UTI. Some tenderness at R adnexal area--Ddx reviewed. Consider [place]. Will try heat and ibuprofen to treat muscle strain (in back, and may also help with abdomen).  If symptoms persist, may need [place] vs CT scan. Reviewed red flag symptoms to seek more immediate care.

## 2019-07-25 NOTE — Patient Instructions (Addendum)
Your urine and exam were normal today. You need to drink more water (urine was concentrated).  You had some tenderness at the right lower abdomen, as well as the muscles in the back.  Let's treat it as a possible strain, with heat and ibuprofen. If pain is increasing, if you develop fever, nausea/vomiting or other changes in your symptoms, please let us know.  We may then need to do additional evaluation (bloodwork and/or imaging).  Stay well!

## 2019-08-10 ENCOUNTER — Other Ambulatory Visit: Payer: Self-pay | Admitting: Family Medicine

## 2019-08-19 ENCOUNTER — Encounter: Payer: Self-pay | Admitting: Family Medicine

## 2019-08-19 ENCOUNTER — Other Ambulatory Visit: Payer: Self-pay | Admitting: Family Medicine

## 2019-08-19 DIAGNOSIS — M810 Age-related osteoporosis without current pathological fracture: Secondary | ICD-10-CM

## 2019-08-19 DIAGNOSIS — Z1231 Encounter for screening mammogram for malignant neoplasm of breast: Secondary | ICD-10-CM

## 2019-08-26 ENCOUNTER — Other Ambulatory Visit: Payer: Self-pay | Admitting: Family Medicine

## 2019-08-26 ENCOUNTER — Other Ambulatory Visit: Payer: Self-pay | Admitting: Cardiovascular Disease

## 2019-09-05 ENCOUNTER — Telehealth: Payer: Self-pay | Admitting: *Deleted

## 2019-09-05 DIAGNOSIS — E039 Hypothyroidism, unspecified: Secondary | ICD-10-CM

## 2019-09-05 DIAGNOSIS — R7301 Impaired fasting glucose: Secondary | ICD-10-CM

## 2019-09-05 DIAGNOSIS — E7800 Pure hypercholesterolemia, unspecified: Secondary | ICD-10-CM

## 2019-09-05 DIAGNOSIS — Z5181 Encounter for therapeutic drug level monitoring: Secondary | ICD-10-CM

## 2019-09-05 NOTE — Telephone Encounter (Signed)
Done (last labs were 04/2019, so a little early, but did as if 6 months)

## 2019-09-05 NOTE — Telephone Encounter (Signed)
Patient has AWV next Wed afternoon 10/7-scheduled appt for fasting labs Monday am 09/09/19 @ 10:00-needs orders please and thanks.

## 2019-09-09 ENCOUNTER — Other Ambulatory Visit: Payer: Self-pay

## 2019-09-10 NOTE — Patient Instructions (Addendum)
HEALTH MAINTENANCE RECOMMENDATIONS:  It is recommended that you get at least 30 minutes of aerobic exercise at least 5 days/week (for weight loss, you may need as much as 60-90 minutes). This can be any activity that gets your heart rate up. This can be divided in 10-15 minute intervals if needed, but try and build up your endurance at least once a week.  Weight bearing exercise is also recommended twice weekly.  Eat a healthy diet with lots of vegetables, fruits and fiber.  "Colorful" foods have a lot of vitamins (ie green vegetables, tomatoes, red peppers, etc).  Limit sweet tea, regular sodas and alcoholic beverages, all of which has a lot of calories and sugar.  Up to 1 alcoholic drink daily may be beneficial for women (unless trying to lose weight, watch sugars).  Drink a lot of water.  Calcium recommendations are 1200-1500 mg daily (1500 mg for postmenopausal women or women without ovaries), and vitamin D 1000 IU daily.  This should be obtained from diet and/or supplements (vitamins), and calcium should not be taken all at once, but in divided doses.  Monthly self breast exams and yearly mammograms for women over the age of 43 is recommended.  Sunscreen of at least SPF 30 should be used on all sun-exposed parts of the skin when outside between the hours of 10 am and 4 pm (not just when at beach or pool, but even with exercise, golf, tennis, and yard work!)  Use a sunscreen that says "broad spectrum" so it covers both UVA and UVB rays, and make sure to reapply every 1-2 hours.  Remember to change the batteries in your smoke detectors when changing your clock times in the spring and fall. Carbon monoxide detectors are recommended for your home.  Use your seat belt every time you are in a car, and please drive safely and not be distracted with cell phones and texting while driving.   Stacy Carrillo , Thank you for taking time to come for your Medicare Wellness Visit. I appreciate your ongoing  commitment to your health goals. Please review the following plan we discussed and let me know if I can assist you in the future.   This is a list of the screening recommended for you and due dates:  Health Maintenance  Topic Date Due   Mammogram  02/16/2019   Colon Cancer Screening  01/19/2021   Tetanus Vaccine  07/05/2023   Flu Shot  Completed   DEXA scan (bone density measurement)  Completed    Hepatitis C: One time screening is recommended by Center for Disease Control  (CDC) for  adults born from [birth year] through 1965.   Completed   Pneumonia vaccines  Completed   Mammogram and bone density tests are due. Please call to schedule them.  You should be hearing from [place] about your next Prolia dose soon (due later this month).  Try using your acid-reducing medication before or with the meal that might trigger symptoms.  You can double up on the nexium to 40mg  if you find the 20mg  isn't effective.  If it is effective, but needing it daily, and would prefer something "safer", Pepcid 20mg  would be the replacement for the zantac you used to take.  It should be twice daily, but you may only need to take it one daily with dinner/bedtime.  (famotidine is the generic name).  Consider taking coenzyme Q10 which helps prevent the muscular side effects from the statins.  Try increasing your Requip by  taking 1 at 6pm and increasing to 2 tablets at 10-11pm, to see if this helps you stay asleep better. For those nights that nothing seems to work, you can try taking a lorazepam. We briefly discussed other medications for restless legs including gabapentin, and also clonazepam (instead of lorazepam).  Try taking the celebrex once daily with food for up to 10 day. Trying to avoid stairs when possible, and limit the weight you're carrying while on the stairs) If your knee pain is worsening, follow up with Dr. Rhona Raider.

## 2019-09-10 NOTE — Progress Notes (Signed)
Chief Complaint  Patient presents with  . Medicare Wellness    fasting AWV with pelvic exam. Sees eye doctor for eye exams. Her knee pain has been worsening since last year. RLS is driving her crazy. Husband says she is snoring more.     Stacy Carrillo is a 68 y.o. female who presents for Medicare wellness visit and follow-up on chronic medical conditions.  She has the following concerns:  She is complaining of bilateral knee pain.  She previously saw Dr. Rhona Raider and had cortisone shots with good results.  She is carrying her grandson up and down the steps a lot, which makes it worse, and improves when she hasn't done that for a few days in a row. She hasn't tried taking any medications for the pain.  She has some Celebrex at home (hasn't taken)  She is snoring more. She wakes up at night with one nostril or the other stopped up.  Last sleep study was 11/2015, and was normal.  One in 2007 showed OSA.  She has a lot of sleep issues, including RLS, so she doesn't feel refreshed when she wakes up.  Denies feeling sleepy during the day.  No morning headaches.  Osteoporosis: Shehas been gettingProlia injections sinceMarch, 2018,toleratingwithout side effects. Last injection was 04/01/2019.  She is due for f/u DEXA, has been ordered. Last DEXAin 1/2018showed: ASSESSMENT: The BMD measured at AP Spine L1-L4 is 0.888 g/cm2 with a T-score of -2.5. This patient is considered osteoporotic according to [facility]) criteria. Site Region Measured Measured WHO Young Adult BMD Date Age Classification T-score AP Spine L1-L4 12/15/2016 65.3 Osteoporosis -2.5 0.888 g/cm2 DualFemur Neck Right 12/15/2016 65.3 Osteopenia -1.6 0.821 g/cm2   Hyperlipidemia: She is under the care of Dr. Claiborne Billings. She had been on Zetia, had refusedstatinsbut finally put onCrestor in April, 2018. LDL went from 140 on zetia alone, to 67 on Crestor.She denies side effects from the statin.  Last lipids were at goal. She is fasting for labs today. We previously discussed the question of whether or not she needs to continue the Zetia, since she is tolerating Crestor.   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 Every once in a while she feels like something jumps/skips in her throat/upper chest, relieved by a cough in a few seconds. sometimes it "sneaks up", isn't able to cough, and feels a little lightheaded. Last episode was 2-3 months ago, happened twice in the last 6 months, and felt lightheaded with these last couple of episodes. Has had heart monitors in the past.  Hypothyroidism: On generic thyroid medication. Taking it on an empty stomach, separate from other medications and vitamins. Denies any missed doses. No changes in bowels,hair/skin/nails, energy, moods. Maybe hair is falling out a little more, no thinning noted.  Lab Results  Component Value Date   TSH 3.950 04/22/2019    She has h/o GERD and HH. She had taken Nexium for a long time (approx 10 years), hadbeen off for over5 years. She stopped due to potential concerns with long-term use, not any side effects. Shehadchanged to Zantac for a while, but is now using Nexium just prn (it was more effective, and then zantac not available). She uses this just prn, used to only  be a couple of times/week. She is now having some more problems, more burning in her throat, "half-hiccup" after eating.  This has been going on daily for the last couple of months.  Admits to having more salsa and tomatoes recently. Nexium is effective.  Recalls being told she has a HH.    Depression and insomnia:SeesNP Pauline Good just for med refills, only every 6 months. Not currently getting  counseling.She also has insomnia, partly contributed by RLS (see below).  She had tried Belsomra in the past (but didn't titrate above 10mg  dose, wasn't effective). We discuissed at her May visit that she could re-try, and titrate up to 20mg  if needed. She recalls re-trying it, helped just for a few days, hard to recall.  RLS--controlled with current medication (which is prescribed by psych), takes at 6 and 11pm. Initially reported sleeping much better since the second dose was added, no longer as good.  When she took 2 tablets at 6pm, it made her drowsy.  Some nights it is hard to fall asleep, once she falls asleep, she tosses and turns, gets up, has a hard time all night.  This occurs at least 1x/week, sometimes twice.  "It is torture".  No known trigger or reason. Sometimes gets up to read, glass of milk. Sometimes there may be a component of RLS, but not always. Can only sleep 3 hours at a time.  Nose being stopped up has also been contributing.  She slept great when she went to the beach in August (was with her whole family).  She will be moving to [place] to be closer to her son/grandson.  Putting the house on the market in the next few weeks.   Immunization History  Administered Date(s) Administered  . Fluad Quad(high Dose 65+) 07/25/2019  . Influenza Split 11/26/2012  . Influenza, High Dose Seasonal PF 08/29/2016, 08/31/2017, 08/20/2018  . Influenza,inj,Quad PF,6+ Mos 11/14/2013, 08/14/2014, 08/25/2015  . Pneumococcal Conjugate-13 08/29/2016  . Pneumococcal Polysaccharide-23 08/31/2017  . Tdap 07/04/2013  . Zoster 07/04/2013  . Zoster Recombinat (Shingrix) 09/06/2018, 12/10/2018   Last Pap smear: s/p hysterectomy Last mammogram:02/2018 Last colonoscopy: 2/2017Dr. Vira Agar in [place]--tubular adenoma and internal hemorrhoids Last DEXA:12/2016 T-2.5 spine Dentist: twice yearly  Ophtho: yearly  Exercise: carrying grandson up and downstairs (watches him 3 days/week), and some  gardening.  Has a Peloton, hasn't used recently.  Other doctors caring for patient include: Dentist: Dr. Orlando Penner Ophtho: Dr. Satira Sark Cardiologist: Dr. Claiborne Billings ENT: Dr. Benjamine Mola Psych:Karen Ronnald Ramp, NP (for meds) GI: Dr. Gaylyn Cheers [facility] clinic)--retired Derm: Dr. Nehemiah Massed [facility]) Ortho: Dr. Rhona Raider (for left knee pain--got cortisone shots); Dr. Berenice Primas for L radial neck fracture 06/2018)  Depression screen: negative Fall screen: none Functional Status Survey: notable for hearing loss, sees Dr. Benjamine Mola. Knee pain Mini-Cog Screen: normal (score of 5)  See full questionnaires in epic  End of Life Discussion: Patient hasa living will and medical power of attorney, which is scanned into her chart.  Past Medical History:  Diagnosis Date  . Anxiety    GENERALIZED/WILLIAM CHEN MD  . Colon polyps   . Depression panic(dr.william chen)  . Elevated cholesterol   . Fracture of radial neck, closed 06/2018   fell while in [place]  . GERD (gastroesophageal reflux disease) HH per pt EGD nl 2/06  . Hypothyroid   . IBS (irritable bowel syndrome) constipation(dr elliot Bagley)  . OSA (obstructive sleep apnea) moderate, not using CPAP, aware of risks   2007; sleep study 11/2015 NORMAL  .  Osteopenia hx  . RLS (restless legs syndrome)   . Vitamin D deficiency     Past Surgical History:  Procedure Laterality Date  . ABDOMINAL HYSTERECTOMY  1999   and BSO (for benign reasons)  . APPENDECTOMY  1999  . BREAST BIOPSY Left    bx or asp not sure  . COLONOSCOPY  2011   Dr. Vira Agar [facility] [place])  . COLONOSCOPY WITH PROPOFOL N/A 01/20/2016   Procedure: COLONOSCOPY WITH PROPOFOL;  Surgeon: Manya Silvas, MD;  [facility] END[facility]COPY;  Service: Endoscopy;  Laterality: N/A;  . ESOPHAGOGASTRODUODENOSCOPY  2009  . TONSILLECTOMY      Social History   Socioeconomic History  . Marital status: Married    Spouse name: Not on file  . Number of children: 1  . Years of  education: Not on file  . Highest education level: Not on file  Occupational History  . Occupation: homemaker  Social Needs  . Financial resource strain: Not on file  . Food insecurity    Worry: Not on file    Inability: Not on file  . Transportation needs    Medical: Not on file    Non-medical: Not on file  Tobacco Use  . Smoking status: Never Smoker  . Smokeless tobacco: Never Used  Substance and Sexual Activity  . Alcohol use: Yes    Comment: socially, 1 glass wine bi-weekly  . Drug use: No  . Sexual activity: Yes    Partners: Male  Lifestyle  . Physical activity    Days per week: Not on file    Minutes per session: Not on file  . Stress: Not on file  Relationships  . Social Herbalist on phone: Not on file    Gets together: Not on file    Attends religious service: Not on file    Active member of club or organization: Not on file    Attends meetings of clubs or organizations: Not on file    Relationship status: Not on file  . Intimate partner violence    Fear of current or ex partner: Not on file    Emotionally abused: Not on file    Physically abused: Not on file    Forced sexual activity: Not on file  Other Topics Concern  . Not on file  Social History Narrative   Lives with husband.  Son lives in [place], married (1 grandson 09/2018)   Moving to [place] (since they are watching their grandson 3x/week)    Family History  Problem Relation Age of Onset  . Heart disease Mother   . Cancer Father        stomach and liver  . Sarcoidosis Sister   . Heart disease Brother 76       stents again at age 44  . Hypercholesterolemia Brother   . Kidney Stones Brother   . Depression Brother   . Breast cancer Cousin        50's  . Diabetes Other        gestational  . Heart disease Maternal Aunt   . Heart disease Paternal Aunt   . Heart disease Paternal Uncle   . Stroke Neg Hx   . Colon cancer Neg Hx     Outpatient Encounter Medications as of 09/11/2019   Medication Sig Note  . desvenlafaxine (PRISTIQ) 50 MG 24 hr tablet Take 50 mg by mouth daily.     Marland Kitchen ezetimibe (ZETIA) 10 MG tablet TAKE 1 TABLET  BY MOUTH EVERY DAY   . L-Methylfolate (DEPLIN PO) Take by mouth.   . levothyroxine (SYNTHROID) 25 MCG tablet TAKE 1 TABLET BY MOUTH EVERY DAY   . metoprolol succinate (TOPROL-XL) 25 MG 24 hr tablet TAKE 1 TABLET BY MOUTH EVERY DAY 04/24/2019: Takes 1/2 tablet in the morning  . rOPINIRole (REQUIP) 0.5 MG tablet Take 0.5 mg by mouth 2 (two) times daily.   . rosuvastatin (CRESTOR) 10 MG tablet TAKE 1 TABLET BY MOUTH EVERY DAY   . cetirizine (ZYRTEC) 10 MG tablet Take 10 mg by mouth daily.   Marland Kitchen esomeprazole (NEXIUM) 40 MG capsule Take 1 capsule (40 mg total) by mouth daily. (Patient not taking: Reported on 09/11/2019) 04/24/2019: Using OTC Nexium (20mg ) just a couple of times/week  . fluticasone (FLONASE) 50 MCG/ACT nasal spray Place 1 spray into both nostrils daily. 08/31/2017: Uses prn  . LORazepam (ATIVAN) 0.5 MG tablet Take 0.5 mg by mouth as needed.     . [DISCONTINUED] rOPINIRole (REQUIP) 0.25 MG tablet Take 0.25 mg by mouth at bedtime. Take 1 tab by mouth 1-2 hours before bed time, may increase to 5 tabs daily  09/06/2018: Take one at 6pm and 11pm   No facility-administered encounter medications on file as of 09/11/2019.     Allergies  Allergen Reactions  . Zithromax [Azithromycin] Hives   ROS: The patient denies anorexia, fever, headaches (rare migraines, occasional aura); denies vision changes, ear pain, sore throat, breast concerns, chest pain, syncope, dyspnea on exertion, cough, nausea, vomiting, diarrhea, constipation, abdominal pain, melena, hematochezia, hematuria, incontinence, vaginal bleeding, discharge, odor or itch, genital lesions, numbness, tingling, weakness, tremor, suspicious skin lesions, abnormal bleeding/bruising, or enlarged lymph nodes.  Some hearing loss and chronic tinnitus (bilateral), unchanged. Was looking into getting  hearing aides prior to [place]. Vaginal dryness,mild, uses lubricants Some hot flashes, tolerable. Knee pain, bilaterally per HPI.   PHYSICAL EXAM:  BP 130/70   Pulse 80   Temp 98.4 F (36.9 C) (Other (Comment))   Ht 5\' 6"  (1.676 m)   Wt 203 lb 9.6 oz (92.4 kg)   BMI 32.86 kg/m   Wt Readings from Last 3 Encounters:  09/11/19 203 lb 9.6 oz (92.4 kg)  07/25/19 204 lb 9.6 oz (92.8 kg)  12/31/18 204 lb (92.5 kg)    General Appearance:  Alert, cooperative, no distress, appears stated age   Head:  Normocephalic, without obvious abnormality, atraumatic   Eyes:  PERRL, conjunctiva/corneas clear, EOM's intact, fundi benign   Ears:  Normal TM's and external ear canals  Nose:  Not examined, wearing mask due to COVID-19 pandemic   Throat:  Not examined, wearing mask due to COVID-19 pandemic  Neck:  Supple, no lymphadenopathy; thyroid: no enlargement/ tenderness/nodules; no carotid bruit or JVD   Back:  Spine nontender, no curvature, ROM normal, no CVA tenderness.  Lungs:  Clear to auscultation bilaterally without wheezes, rales or ronchi; respirations unlabored   Chest Wall:  No tenderness or deformity   Heart:  Regular rate and rhythm, S1 and S2 normal, no murmur, rub or gallop   Breast Exam:  No nipple inversion, nipple discharge. No dominant masses or axillary lymphadenopathy.   Abdomen:  Soft, non-tender, nondistended, normoactive bowel sounds, no masses, no hepatosplenomegaly   Genitalia:  normal external genitalia, with mild atrophic changes. Bimanual exam is normal--no appreciable masses. Uterus and ovaries surgically absent.   Rectal: Normal sphincter tone, no masses; no stool in vault for heme testing  Extremities:  No clubbing, cyanosis or  edema. Knees without warmth or effusion, FROM, slight crepitus. No joint line tenderness  Pulses:  2+ and symmetric all extremities   Skin:  Skin color, texture, turgor normal, no rashes. 33mm red nodule below  theleftknee, unchanged (angioma).  Lymph nodes:  Cervical, supraclavicular, and axillary nodes normal   Neurologic:  CNII-XII intact, normal strength, sensation and gait; reflexes 2+ and symmetric throughout   Psych: Normal mood, affect, hygiene and grooming   ASSESSMENT/PLAN:  Medicare annual wellness visit, subsequent  Impaired fasting glucose - Encouraged daily exercise, limiting sugar/carbs, weight loss - Plan: HgB A1c, CANCELED: Hemoglobin A1c  Osteoporosis, unspecified osteoporosis type, unspecified pathological fracture presence - continue Prolia injections (due the end of the month); DEXA past due, reminded to schedule  Pure hypercholesterolemia - due for recheck.  Likely can stop Zetia; whether Crestor dose needs to be increased or not needs to be determined--will send results to Dr. Claiborne Billings - Plan: Lipid panel  Hypothyroidism, unspecified type - continue 25 mcg dose - Plan: TSH  RLS (restless legs syndrome) - try taking 1 tablet at 6, and 2 at 10-11pm, to see if helps with RLS/sleep. Discussed other tx options (clonazepam, gabapentin)  Insomnia, unspecified type - counseled re: sleep hygiene. Consider trying lorazepam prn for those very restless nights (1-2x/week).   Palpitations - infrequently has palpitations associated with lightheadedness. To discuss with Dr. Claiborne Billings. Cont beta blocker  Medication monitoring encounter - Plan: Comprehensive metabolic panel, TSH, Lipid panel, CBC with Differential/Platelet  Pain in both knees, unspecified chronicity - wt loss rec (and not carrying baby up and down stairs, if poss); trial of celebrex she still has. F/u with ortho if worsening  Send labs to Dr. Claiborne Billings. ?need for repeat monitor given her LH with palpitations, though very infrequent. Snoring--likely contributed by nasal congestion, treat for allergies. Last sleep study was normal. No other symptoms related to OSA (tired some from insomnia/RLS).  Reminded to  schedule DEXA and mammogram Prolia dose due later in October  Discussed monthly self breast exams and yearly mammograms; at least 30 minutes of aerobic activity at least 5 days/week and weight-bearing exercise 2x/week; proper sunscreen use reviewed; healthy diet, including goals of calcium and vitamin D intake and alcohol recommendations (less than or equal to 1 drink/day) reviewed; regular seatbelt use; changing batteries in smoke detectors. Immunization recommendations discussed--continue yearly high dose flu shots.Colonoscopy recommendations reviewed--due in 01/2021 (5 years).   Full Code, Full Care  F/u 1 year (sooner prn if needed for labs based on symptoms or med changes) She will be moving to [place] but would like to continue to come here. AWV/med check 1 year   Medicare Attestation I have personally reviewed: The patient's medical and social history Their use of alcohol, tobacco or illicit drugs Their current medications and supplements The patient's functional ability including ADLs,fall risks, home safety risks, cognitive, and hearing and visual impairment Diet and physical activities Evidence for depression or mood disorders  The patient's weight, height, BMI, and visual acuity have been recorded in the chart.  I have made referrals, counseling, and provided education to the patient based on review of the above and I have provided the patient with a written personalized care plan for preventive services.

## 2019-09-11 ENCOUNTER — Encounter: Payer: Self-pay | Admitting: Family Medicine

## 2019-09-11 ENCOUNTER — Other Ambulatory Visit: Payer: Self-pay

## 2019-09-11 ENCOUNTER — Ambulatory Visit (INDEPENDENT_AMBULATORY_CARE_PROVIDER_SITE_OTHER): Payer: Medicare Other | Admitting: Family Medicine

## 2019-09-11 VITALS — BP 130/70 | HR 80 | Temp 98.4°F | Ht 66.0 in | Wt 203.6 lb

## 2019-09-11 DIAGNOSIS — Z0000 Encounter for general adult medical examination without abnormal findings: Secondary | ICD-10-CM

## 2019-09-11 DIAGNOSIS — R002 Palpitations: Secondary | ICD-10-CM

## 2019-09-11 DIAGNOSIS — M25561 Pain in right knee: Secondary | ICD-10-CM | POA: Diagnosis not present

## 2019-09-11 DIAGNOSIS — M810 Age-related osteoporosis without current pathological fracture: Secondary | ICD-10-CM | POA: Diagnosis not present

## 2019-09-11 DIAGNOSIS — G2581 Restless legs syndrome: Secondary | ICD-10-CM | POA: Diagnosis not present

## 2019-09-11 DIAGNOSIS — E039 Hypothyroidism, unspecified: Secondary | ICD-10-CM

## 2019-09-11 DIAGNOSIS — M25562 Pain in left knee: Secondary | ICD-10-CM | POA: Diagnosis not present

## 2019-09-11 DIAGNOSIS — E7800 Pure hypercholesterolemia, unspecified: Secondary | ICD-10-CM | POA: Diagnosis not present

## 2019-09-11 DIAGNOSIS — Z5181 Encounter for therapeutic drug level monitoring: Secondary | ICD-10-CM | POA: Diagnosis not present

## 2019-09-11 DIAGNOSIS — R7301 Impaired fasting glucose: Secondary | ICD-10-CM

## 2019-09-11 DIAGNOSIS — G4700 Insomnia, unspecified: Secondary | ICD-10-CM

## 2019-09-11 LAB — POCT GLYCOSYLATED HEMOGLOBIN (HGB A1C): Hemoglobin A1C: 6.2 % — AB (ref 4.0–5.6)

## 2019-09-12 LAB — COMPREHENSIVE METABOLIC PANEL
ALT: 20 IU/L (ref 0–32)
AST: 31 IU/L (ref 0–40)
Albumin/Globulin Ratio: 1.8 (ref 1.2–2.2)
Albumin: 4.7 g/dL (ref 3.8–4.8)
Alkaline Phosphatase: 55 IU/L (ref 39–117)
BUN/Creatinine Ratio: 13 (ref 12–28)
BUN: 13 mg/dL (ref 8–27)
Bilirubin Total: 0.5 mg/dL (ref 0.0–1.2)
CO2: 24 mmol/L (ref 20–29)
Calcium: 9.6 mg/dL (ref 8.7–10.3)
Chloride: 100 mmol/L (ref 96–106)
Creatinine, Ser: 1 mg/dL (ref 0.57–1.00)
GFR calc Af Amer: 67 mL/min/{1.73_m2} (ref >59)
GFR calc non Af Amer: 58 mL/min/{1.73_m2} — ABNORMAL LOW (ref >59)
Globulin, Total: 2.6 g/dL (ref 1.5–4.5)
Glucose: 96 mg/dL (ref 65–99)
Potassium: 4.6 mmol/L (ref 3.5–5.2)
Sodium: 138 mmol/L (ref 134–144)
Total Protein: 7.3 g/dL (ref 6.0–8.5)

## 2019-09-12 LAB — CBC WITH DIFFERENTIAL/PLATELET
Basophils Absolute: 0.1 10*3/uL (ref 0.0–0.2)
Basos: 1 %
EOS (ABSOLUTE): 0.1 10*3/uL (ref 0.0–0.4)
Eos: 2 %
Hematocrit: 44.5 % (ref 34.0–46.6)
Hemoglobin: 15.2 g/dL (ref 11.1–15.9)
Immature Grans (Abs): 0 10*3/uL (ref 0.0–0.1)
Immature Granulocytes: 0 %
Lymphocytes Absolute: 2.1 10*3/uL (ref 0.7–3.1)
Lymphs: 34 %
MCH: 30.6 pg (ref 26.6–33.0)
MCHC: 34.2 g/dL (ref 31.5–35.7)
MCV: 90 fL (ref 79–97)
Monocytes Absolute: 0.6 10*3/uL (ref 0.1–0.9)
Monocytes: 9 %
Neutrophils Absolute: 3.4 10*3/uL (ref 1.4–7.0)
Neutrophils: 54 %
Platelets: 212 10*3/uL (ref 150–450)
RBC: 4.96 x10E6/uL (ref 3.77–5.28)
RDW: 12.7 % (ref 11.7–15.4)
WBC: 6.3 10*3/uL (ref 3.4–10.8)

## 2019-09-12 LAB — LIPID PANEL
Chol/HDL Ratio: 2.6 ratio (ref 0.0–4.4)
Cholesterol, Total: 167 mg/dL (ref 100–199)
HDL: 64 mg/dL (ref >39)
LDL Chol Calc (NIH): 88 mg/dL (ref 0–99)
Triglycerides: 82 mg/dL (ref 0–149)
VLDL Cholesterol Cal: 15 mg/dL (ref 5–40)

## 2019-09-12 LAB — TSH: TSH: 3.25 u[IU]/mL (ref 0.450–4.500)

## 2019-09-24 ENCOUNTER — Other Ambulatory Visit: Payer: Self-pay | Admitting: Cardiovascular Disease

## 2019-10-03 ENCOUNTER — Other Ambulatory Visit: Payer: Medicare Other

## 2019-10-03 ENCOUNTER — Telehealth (INDEPENDENT_AMBULATORY_CARE_PROVIDER_SITE_OTHER): Payer: Medicare Other | Admitting: Cardiovascular Disease

## 2019-10-03 VITALS — Ht 66.0 in

## 2019-10-03 DIAGNOSIS — R002 Palpitations: Secondary | ICD-10-CM

## 2019-10-03 DIAGNOSIS — E039 Hypothyroidism, unspecified: Secondary | ICD-10-CM | POA: Diagnosis not present

## 2019-10-03 DIAGNOSIS — E7800 Pure hypercholesterolemia, unspecified: Secondary | ICD-10-CM | POA: Diagnosis not present

## 2019-10-03 DIAGNOSIS — G2581 Restless legs syndrome: Secondary | ICD-10-CM | POA: Diagnosis not present

## 2019-10-03 MED ORDER — METOPROLOL SUCCINATE ER 25 MG PO TB24: 37.5000 mg | ORAL_TABLET | Freq: Every day | ORAL | 11 refills | Status: DC

## 2019-10-03 MED ORDER — METOPROLOL SUCCINATE ER 25 MG PO TB24: 25.0000 mg | ORAL_TABLET | Freq: Every day | ORAL | 11 refills | Status: DC

## 2019-10-03 NOTE — Progress Notes (Signed)
Virtual Visit via Telephone Note   This visit type was conducted due to national recommendations for restrictions regarding the COVID-19 Pandemic (e.g. social distancing) in an effort to limit this patient's exposure and mitigate transmission in our community.  Due to Stacy Carrillo co-morbid illnesses, this patient is at least at moderate risk for complications without adequate follow up.  This format is felt to be most appropriate for this patient at this time.  The patient did not have access to video technology/had technical difficulties with video requiring transitioning to audio format only (telephone).  All issues noted in this document were discussed and addressed.  No physical exam could be performed with this format.  Please refer to the patient's chart for Stacy Carrillo  consent to telehealth for [facility].   Date:  10/03/2019   ID:  Stacy Carrillo, DOB 03/07/1951, MRN [id number]  Patient Location: Home Provider Location: Home  PCP:  Rita Ohara, MD  Cardiologist:  Shelva Majestic, MD  Electrophysiologist:  None   Evaluation Performed:  Follow-Up Visit  Chief Complaint: 1 year f/u  History of Present Illness:    Stacy Carrillo is a 68 y.o. female who has a strong family history for CAD as well as hyperlipidemia. In June 2013 Stacy Carrillo total cholesterol was 273 with an LDL of 186 HDL 16 and triglycerides 94. An NMR profile showed an LDL particle #1940. Stacy Carrillo has a history of hypothyroidism on Synthroid replacement as well as migraine headaches. When I saw Stacy Carrillo in November 2013 I obtained a [facility] lab assessment with genetic profile I recommended at least a trial of Zetia in light of Stacy Carrillo marked hyperlipidemia.  Stacy Carrillo was against statin initiation and did not want to initiate therapy Zetia and sought nutritional guidance.  Stacy Carrillo significantly increased Stacy Carrillo exercise and lost approximately a 30 pound weight loss over a year.  A repeat NMR profile on 05/09/2012: total cholesterol was still elevated at 222,  HDL was excellent at 71 LDL was 137. Stacy Carrillo LDL small particles were normal at 433 but Stacy Carrillo still had increased LDL particle number of 1578 nmol per liter.  As result, I recommend initiation of Zetia 10 mg, which Stacy Carrillo conceded to initiate.  An echo Doppler study in September 2013 was essentially normal and showed an EF of greater than 55%.  There was trace tricuspid regurgitation.  Stacy Carrillo had normal right ventricular systolic pressure.  Repeat laboratory in September 2015  showed a total cholesterol 183, triglycerides 75, HDL 70, VLDL 15, and LDL cholesterol at 98.  A single blood sugar was 101.  Stacy Carrillo normal renal function.  Stacy Carrillo normal electrolytes.  Thyroid function was normal.  Stacy Carrillo has had difficulty with musculoskeletal issues.  Stacy Carrillo  had hip pain and was diagnosed with an external oblique tear as well as gluteal tear.    In 2016 Stacy Carrillo was back exercising and doing Pilates at least 3 days per week.  Stacy Carrillo does have some issues with anxiety. Stacy Carrillo has occasional chest fluttering and heart skipping. Stacy Carrillo underwent a sleep study and was  told of not having obstructive sleep apnea. I have personally reviewed this study which was done at [facility] hospital on 11/23/2015. Stacy Carrillo AHI was 2.4  Stacy Carrillo slept with 97.4% sleep efficiency. Stacy Carrillo oxygen saturation nadir was 90%. Stacy Carrillo denies any chest pain.   Due to palpitations, I initiated low-dose Toprol-XL at 12.5 mg and sagittally titrated this to 12.5 mg.  Since I last saw Stacy Carrillo, Stacy Carrillo has been taking Zetia for hyperlipidemia.  Stacy Carrillo has restless legs and is on reck with.  Stacy Carrillo is on low-dose levothyroxine at 25 g for mild hypothyroidism.  Stacy Carrillo has been followed by Dr. Aris Georgia.  Stacy Carrillo has had reflux symptoms and is taking Zantac 150 mg in addition to Nexium.  In September 2017.  Stacy Carrillo had blood work by Dr. Tomi Bamberger, which showed Stacy Carrillo cholesterol had increased to 222 and LDL 140.  Stacy Carrillo thyroid function was normal.  Stacy Carrillo normal renal function.  Hemoglobin A1c had improved to 5.4.  Stacy Carrillo denies any  chest pain.  For heartburn is better since initiating Nexium.  At times Stacy Carrillo notes some tingling of Stacy Carrillo hands and feet.  Stacy Carrillo continues to do Pilates 3 days per week and uses an exercise bike at least 2-3 days per week.  I saw Stacy Carrillo last in April 2018 Stacy Carrillo agreed to initiate statin therapy and started Stacy Carrillo on Crestor 10 mg 2 times per week for 2 weeks with very slow gradual titration as tolerated.    Stacy Carrillo was evaluated by Almyra Deforest, PA in January 2019 with palpitations.  Stacy Carrillo subsequently wore a cardiac monitor which showed sinus rhythm without episodes of atrial fibrillation.  Sinus tachycardia was present for 7% of the readable data and sinus bradycardia for 1%.  There were no pauses.  There were rare PACs and isolated unifocal PVCs.  Stacy Carrillo palpitations have improved on current therapy.  Stacy Carrillo had follow-up laboratory on August 31, 2018 which showed marked improvement in LDL cholesterol at 69 on Stacy Carrillo rosuvastatin 10 mg in addition to Zetia 10 mg.  Stacy Carrillo was on  low-dose levothyroxine 25 mcg.  Stacy Carrillo has restless legs and has been taking low-dose Requip.    Since I last saw Stacy Carrillo in October 2019, Stacy Carrillo is continued to do well.  Stacy Carrillo had a grandson born last October.  Stacy Carrillo son and daughter-in-law live in the [place] area.  Stacy Carrillo and Stacy Carrillo husband is just purchased a home in [place] and plan to move there to be closer to Stacy Carrillo son and grandchild.  He had just placed Stacy Carrillo house on the market in [place] but have not yet moved.  Stacy Carrillo denies any chest pain.  Stacy Carrillo still experiences rare to occasional episodes of palpitations.  Stacy Carrillo has continued to tolerate Zetia and Crestor.  Stacy Carrillo was breaking Stacy Carrillo Toprol pill in half but was uncertain if this was a 25 mg pill or a 50 mg pill.  Stacy Carrillo sees Dr. Tomi Bamberger for primary care.  Stacy Carrillo presents for yearly evaluation.  The patient does not have symptoms concerning for COVID-19 infection (fever, chills, cough, or new shortness of breath).    Past Medical History:  Diagnosis Date   Anxiety     GENERALIZED/WILLIAM CHEN MD   Colon polyps    Depression panic(dr.william chen)   Elevated cholesterol    Fracture of radial neck, closed 06/2018   fell while in [place]   GERD (gastroesophageal reflux disease) HH per pt EGD nl 2/06   Hypothyroid    IBS (irritable bowel syndrome) constipation(dr elliot [place])   OSA (obstructive sleep apnea) moderate, not using CPAP, aware of risks   2007; sleep study 11/2015 NORMAL   Osteopenia hx   RLS (restless legs syndrome)    Vitamin D deficiency    Past Surgical History:  Procedure Laterality Date   ABDOMINAL HYSTERECTOMY  1999   and BSO (for benign reasons)   APPENDECTOMY  1999   BREAST BIOPSY Left    bx or asp not sure   COLONOSCOPY  2011   Dr. Vira Agar ([place])   COLONOSCOPY WITH PROPOFOL N/A 01/20/2016   Procedure: COLONOSCOPY WITH PROPOFOL;  Surgeon: Manya Silvas, MD;  Location: [facility] ENDOSCOPY;  Service: Endoscopy;  Laterality: N/A;   ESOPHAGOGASTRODUODENOSCOPY  2009   TONSILLECTOMY       Current Meds  Medication Sig   cetirizine (ZYRTEC) 10 MG tablet Take 10 mg by mouth daily.   desvenlafaxine (PRISTIQ) 50 MG 24 hr tablet Take 50 mg by mouth daily.     esomeprazole (NEXIUM) 40 MG capsule Take 1 capsule (40 mg total) by mouth daily.   ezetimibe (ZETIA) 10 MG tablet TAKE 1 TABLET BY MOUTH EVERY DAY   fluticasone (FLONASE) 50 MCG/ACT nasal spray Place 1 spray into both nostrils daily.   L-Methylfolate (DEPLIN PO) Take by mouth.   levothyroxine (SYNTHROID) 25 MCG tablet TAKE 1 TABLET BY MOUTH EVERY DAY   LORazepam (ATIVAN) 0.5 MG tablet Take 0.5 mg by mouth as needed.     metoprolol succinate (TOPROL-XL) 25 MG 24 hr tablet TAKE 1 TABLET BY MOUTH EVERY DAY   rosuvastatin (CRESTOR) 10 MG tablet TAKE 1 TABLET BY MOUTH EVERY DAY     Allergies:   Zithromax [azithromycin]   Social History   Tobacco Use   Smoking status: Never Smoker   Smokeless tobacco: Never Used  Substance  Use Topics   Alcohol use: Yes    Comment: socially, 1 glass wine bi-weekly   Drug use: No     Family Hx: The patient's family history includes Breast cancer in Stacy Carrillo cousin; Cancer in Stacy Carrillo father; Depression in Stacy Carrillo brother; Diabetes in an other family member; Heart disease in Stacy Carrillo maternal aunt, mother, paternal aunt, and paternal uncle; Heart disease (age of onset: 35) in Stacy Carrillo brother; Hypercholesterolemia in Stacy Carrillo brother; Kidney Stones in Stacy Carrillo brother; Sarcoidosis in Stacy Carrillo sister. There is no history of Stroke or Colon cancer.  ROS:   Please see the history of present illness.    No fevers chills night sweats No cough Rare palpitations No chest pain No recent myalgias History of osteoporosis.  Occasional knee pain Some arthritic symptoms No swelling Sleeping adequately All other systems reviewed and are negative.   Prior CV studies:   The following studies were reviewed today: I reviewed the recent evaluation from Dr. Tomi Bamberger  No new cardiac studies   Labs/Other Tests and Data Reviewed:    EKG:  An ECG dated 09/10/2018 was personally reviewed today and demonstrated:  Normal sinus rhythm at 77 bpm.  Intervals normal, no ectopy  Recent Labs: 09/11/2019: ALT 20; BUN 13; Creatinine, Ser 1.00; Hemoglobin 15.2; Platelets 212; Potassium 4.6; Sodium 138; TSH 3.250   Recent Lipid Panel Lab Results  Component Value Date/Time   CHOL 167 09/11/2019 03:10 PM   CHOL 222 (H) 05/09/2013 11:17 AM   TRIG 82 09/11/2019 03:10 PM   TRIG 70 05/09/2013 11:17 AM   HDL 64 09/11/2019 03:10 PM   HDL 71 05/09/2013 11:17 AM   CHOLHDL 2.6 09/11/2019 03:10 PM   CHOLHDL 2.3 08/24/2017 10:14 AM   LDLCALC 88 09/11/2019 03:10 PM   LDLCALC 67 08/24/2017 10:14 AM   LDLCALC 137 (H) 05/09/2013 11:17 AM    Wt Readings from Last 3 Encounters:  09/11/19 203 lb 9.6 oz (92.4 kg)  07/25/19 204 lb 9.6 oz (92.8 kg)  12/31/18 204 lb (92.5 kg)     Objective:    Vital Signs:  Ht 5\' 6"  (1.676 m)    BMI 32.86 kg/m  Since this was a virtual evaluation I could not personally examined the patient. Breathing was normal and not labored There was no audible wheezing Palpation of Stacy Carrillo pulse revealed a regular rhythm without extra beats Stacy Carrillo denied any chest wall discomfort to palpation There was no abdominal tenderness There was no swelling Stacy Carrillo denied neurologic symptoms. Stacy Carrillo has been treated for depression Stacy Carrillo had a normal affect and mood  ASSESSMENT & PLAN:    1. Palpitations: Stacy Carrillo had previously worn a event monitor which showed only isolated PVCs and PACs.  By my prior record Stacy Carrillo was on Toprol-XL 25 mg daily and was doing well with significant improvement in Stacy Carrillo previous sensation.  Stacy Carrillo tells me Stacy Carrillo is breaking Stacy Carrillo pill in half.  Stacy Carrillo was not home today and was at their new house in [place].  Stacy Carrillo was uncertain if Stacy Carrillo had a 50 mg or a 25 mg Toprol-XL pill but has only been cutting this in half.  If the pill is 25 mg, I have recommended Stacy Carrillo increase this to 37.5 mg daily.  However, if Stacy Carrillo has a 50 mg pill and is taking only a half of this, I would suggest changing Stacy Carrillo to a 25 mg pill such that Stacy Carrillo will be able to increase Stacy Carrillo dose to 37.5 mg. 2. Pure hyperlipidemia: Remotely Stacy Carrillo had had very elevated lipid studies with LDL cholesterol at 186 and total cholesterol 273 in June 2013.  Lipid studies have significantly improved on combination Zetia and rosuvastatin and when last checked in September 2019 LDL cholesterol was 69.  Stacy Carrillo is followed by Dr. Tomi Bamberger.  Stacy Carrillo continues to have Stacy Carrillo Medicare followed in [place] once they moved to [place] and ultimately sell their house in [place]. 3. Hypothyroidism: Stacy Carrillo continues to be on low-dose levothyroxine. 4. Depression: Currently on Pristiq 50 mg daily 5. History of restless leg syndrome  COVID-19 Education: The signs and symptoms of COVID-19 were discussed with the patient and how to seek care for testing (follow up with PCP or arrange E-visit).  The importance of social  distancing was discussed today.  Time:   Today, I have spent 22 minutes with the patient with telehealth technology discussing the above problems.     Medication Adjustments/Labs and Tests Ordered: Current medicines are reviewed at length with the patient today.  Concerns regarding medicines are outlined above.   Tests Ordered: No orders of the defined types were placed in this encounter.   Medication Changes: No orders of the defined types were placed in this encounter.   Follow Up: one year Signed, Shelva Majestic, MD  10/03/2019 9:34 AM    [place]

## 2019-10-03 NOTE — Patient Instructions (Signed)
Medication Instructions:  INCREASE METOPROLOL 25MG  DAILY-UP FROM 12.5MG  (1/2 TAB) DAILY If you need a refill on your cardiac medications before your next appointment, please call your pharmacy.  Follow-Up: IN 12 months Please call our office 2 months in advance, AUG/SEPT 2021 to schedule this OCT/NOV 2021 appointment. In Person You may see Shelva Majestic, MD or one of the following Advanced Practice Providers on your designated Care Team:  Almyra Deforest, PA-C Fabian Sharp, PA-C or [place], [place].    At [facility], you and your health needs are our priority.  As part of our continuing mission to provide you with exceptional heart care, we have created designated Provider Care Teams.  These Care Teams include your primary Cardiologist (physician) and Advanced Practice Providers (APPs -  Physician Assistants and Nurse Practitioners) who all work together to provide you with the care you need, when you need it.  Thank you for choosing [facility] at [facility]!!

## 2019-10-07 ENCOUNTER — Other Ambulatory Visit (INDEPENDENT_AMBULATORY_CARE_PROVIDER_SITE_OTHER): Payer: Medicare Other

## 2019-10-07 ENCOUNTER — Other Ambulatory Visit: Payer: Self-pay

## 2019-10-07 ENCOUNTER — Ambulatory Visit
Admission: RE | Admit: 2019-10-07 | Discharge: 2019-10-07 | Disposition: A | Discharge status: Home / Self Care | Payer: Medicare Other | Source: Ambulatory Visit | Attending: Family Medicine | Admitting: Family Medicine

## 2019-10-07 DIAGNOSIS — M810 Age-related osteoporosis without current pathological fracture: Secondary | ICD-10-CM | POA: Diagnosis not present

## 2019-10-07 DIAGNOSIS — M818 Other osteoporosis without current pathological fracture: Secondary | ICD-10-CM | POA: Diagnosis not present

## 2019-10-07 DIAGNOSIS — M85851 Other specified disorders of bone density and structure, right thigh: Secondary | ICD-10-CM | POA: Diagnosis not present

## 2019-10-07 MED ORDER — DENOSUMAB 60 MG/ML ~~LOC~~ SOSY
60.0000 mg | PREFILLED_SYRINGE | Freq: Once | SUBCUTANEOUS | Status: AC
  Administered 2019-10-07: 60 mg via SUBCUTANEOUS

## 2019-10-29 ENCOUNTER — Other Ambulatory Visit: Payer: Self-pay | Admitting: Cardiovascular Disease

## 2019-12-16 ENCOUNTER — Ambulatory Visit
Admission: RE | Admit: 2019-12-16 | Discharge: 2019-12-16 | Disposition: A | Discharge status: Home / Self Care | Payer: Medicare Other | Source: Ambulatory Visit | Attending: Family Medicine | Admitting: Family Medicine

## 2019-12-16 DIAGNOSIS — Z1231 Encounter for screening mammogram for malignant neoplasm of breast: Secondary | ICD-10-CM

## 2019-12-26 ENCOUNTER — Encounter: Payer: Self-pay | Admitting: Family Medicine

## 2019-12-30 ENCOUNTER — Encounter: Payer: Self-pay | Admitting: *Deleted

## 2020-01-06 ENCOUNTER — Other Ambulatory Visit: Payer: Self-pay | Admitting: Cardiovascular Disease

## 2020-01-15 ENCOUNTER — Other Ambulatory Visit: Payer: Self-pay | Admitting: Family Medicine

## 2020-02-18 ENCOUNTER — Other Ambulatory Visit: Payer: Self-pay | Admitting: Cardiovascular Disease

## 2020-02-26 DIAGNOSIS — H25019 Cortical age-related cataract, unspecified eye: Secondary | ICD-10-CM | POA: Diagnosis not present

## 2020-03-12 DIAGNOSIS — D2362 Other benign neoplasm of skin of left upper limb, including shoulder: Secondary | ICD-10-CM | POA: Diagnosis not present

## 2020-03-12 DIAGNOSIS — L821 Other seborrheic keratosis: Secondary | ICD-10-CM | POA: Diagnosis not present

## 2020-03-12 DIAGNOSIS — X32XXXA Exposure to sunlight, initial encounter: Secondary | ICD-10-CM | POA: Diagnosis not present

## 2020-03-12 DIAGNOSIS — L570 Actinic keratosis: Secondary | ICD-10-CM | POA: Diagnosis not present

## 2020-03-12 DIAGNOSIS — D225 Melanocytic nevi of trunk: Secondary | ICD-10-CM | POA: Diagnosis not present

## 2020-03-12 DIAGNOSIS — Z7189 Other specified counseling: Secondary | ICD-10-CM | POA: Diagnosis not present

## 2020-03-12 DIAGNOSIS — Z08 Encounter for follow-up examination after completed treatment for malignant neoplasm: Secondary | ICD-10-CM | POA: Diagnosis not present

## 2020-03-12 DIAGNOSIS — Z85828 Personal history of other malignant neoplasm of skin: Secondary | ICD-10-CM | POA: Diagnosis not present

## 2020-03-12 DIAGNOSIS — L738 Other specified follicular disorders: Secondary | ICD-10-CM | POA: Diagnosis not present

## 2020-03-12 DIAGNOSIS — D485 Neoplasm of uncertain behavior of skin: Secondary | ICD-10-CM | POA: Diagnosis not present

## 2020-04-09 ENCOUNTER — Other Ambulatory Visit: Payer: Self-pay

## 2020-04-09 ENCOUNTER — Other Ambulatory Visit (INDEPENDENT_AMBULATORY_CARE_PROVIDER_SITE_OTHER): Payer: Medicare Other

## 2020-04-09 DIAGNOSIS — M810 Age-related osteoporosis without current pathological fracture: Secondary | ICD-10-CM

## 2020-04-09 MED ORDER — DENOSUMAB 60 MG/ML ~~LOC~~ SOSY
60.0000 mg | PREFILLED_SYRINGE | Freq: Once | SUBCUTANEOUS | Status: AC
  Administered 2020-04-09: 60 mg via SUBCUTANEOUS

## 2020-04-13 ENCOUNTER — Encounter: Payer: Self-pay | Admitting: Family Medicine

## 2020-05-19 ENCOUNTER — Other Ambulatory Visit: Payer: Self-pay | Admitting: Family Medicine

## 2020-08-13 ENCOUNTER — Other Ambulatory Visit: Payer: Self-pay | Admitting: Medical

## 2020-09-24 ENCOUNTER — Ambulatory Visit: Payer: Medicare Other | Admitting: Family Medicine

## 2020-10-01 ENCOUNTER — Telehealth: Payer: Self-pay | Admitting: *Deleted

## 2020-10-01 NOTE — Telephone Encounter (Signed)
Per Mrs.Eulenburg, she has moved to [facility] and seen a Cardiologist on yesterday.

## 2020-10-20 ENCOUNTER — Other Ambulatory Visit: Payer: Self-pay | Admitting: Cardiovascular Disease

## 2020-10-20 NOTE — Telephone Encounter (Signed)
Rx has been sent to the pharmacy electronically. ° °

## 2020-11-05 ENCOUNTER — Other Ambulatory Visit: Payer: Self-pay | Admitting: Family Medicine

## 2020-11-05 NOTE — Telephone Encounter (Signed)
Correct. Declined refill

## 2020-11-05 NOTE — Telephone Encounter (Signed)
She does not come here any longer, correct?

## 2020-11-11 ENCOUNTER — Other Ambulatory Visit: Payer: Self-pay | Admitting: Cardiovascular Disease

## 2020-12-08 ENCOUNTER — Other Ambulatory Visit: Payer: Self-pay | Admitting: Cardiovascular Disease
